# Patient Record
Sex: Female | Born: 1970 | Race: White | Hispanic: No | Marital: Single | Smoking: Former smoker
Health system: Southern US, Community
[De-identification: ages and names within clinical notes are randomized; demographics above are authoritative.]

## PROBLEM LIST (undated history)

## (undated) HISTORY — PX: OVARIAN CYST REMOVAL: SHX89

## (undated) HISTORY — PX: OOPHORECTOMY: SHX86

---

## 2013-03-31 ENCOUNTER — Telehealth: Payer: Self-pay

## 2013-03-31 NOTE — Telephone Encounter (Signed)
Left message for call back. Identifiable.  No health records on file.

## 2013-04-01 ENCOUNTER — Encounter: Payer: Self-pay | Admitting: Family Medicine

## 2013-04-01 ENCOUNTER — Ambulatory Visit (INDEPENDENT_AMBULATORY_CARE_PROVIDER_SITE_OTHER): Payer: BC Managed Care – PPO | Admitting: Family Medicine

## 2013-04-01 VITALS — BP 126/76 | HR 68 | Temp 98.4°F | Resp 16 | Ht 66.25 in | Wt 162.2 lb

## 2013-04-01 DIAGNOSIS — Z1231 Encounter for screening mammogram for malignant neoplasm of breast: Secondary | ICD-10-CM

## 2013-04-01 DIAGNOSIS — Z0000 Encounter for general adult medical examination without abnormal findings: Secondary | ICD-10-CM | POA: Insufficient documentation

## 2013-04-01 LAB — BASIC METABOLIC PANEL
BUN: 13 mg/dL (ref 6–23)
CHLORIDE: 106 meq/L (ref 96–112)
CO2: 28 mEq/L (ref 19–32)
Calcium: 9 mg/dL (ref 8.4–10.5)
Creatinine, Ser: 0.7 mg/dL (ref 0.4–1.2)
GFR: 103.39 mL/min (ref >60.00)
Glucose, Bld: 79 mg/dL (ref 70–99)
Potassium: 3.9 mEq/L (ref 3.5–5.1)
Sodium: 138 mEq/L (ref 135–145)

## 2013-04-01 LAB — HEPATIC FUNCTION PANEL
ALBUMIN: 4 g/dL (ref 3.5–5.2)
ALT: 19 U/L (ref 0–35)
AST: 21 U/L (ref 0–37)
Alkaline Phosphatase: 34 U/L — ABNORMAL LOW (ref 39–117)
Bilirubin, Direct: 0.1 mg/dL (ref 0.0–0.3)
Total Bilirubin: 0.4 mg/dL (ref 0.3–1.2)
Total Protein: 7 g/dL (ref 6.0–8.3)

## 2013-04-01 LAB — TSH: TSH: 1.12 u[IU]/mL (ref 0.35–5.50)

## 2013-04-01 LAB — LIPID PANEL
Cholesterol: 138 mg/dL (ref 0–200)
HDL: 47.5 mg/dL (ref >39.00)
LDL Cholesterol: 79 mg/dL (ref 0–99)
Total CHOL/HDL Ratio: 3
Triglycerides: 59 mg/dL (ref 0.0–149.0)
VLDL: 11.8 mg/dL (ref 0.0–40.0)

## 2013-04-01 NOTE — Progress Notes (Signed)
   Subjective:    Patient ID: Kaitlin Faulkner, female    DOB: 1970/10/27, 43 y.o.   MRN: [id number]  HPI New to establish.  No recent MD, last seen in [place] 3-4 yrs ago.  [facility]   CPE- UTD on pap, due to start mammo.  No concerns.  Review of Systems Patient reports no vision/ hearing changes, adenopathy,fever, weight change,  persistant/recurrent hoarseness , swallowing issues, chest pain, palpitations, edema, persistant/recurrent cough, hemoptysis, dyspnea (rest/exertional/paroxysmal nocturnal), gastrointestinal bleeding (melena, rectal bleeding), abdominal pain, significant heartburn, bowel changes, GU symptoms (dysuria, hematuria, incontinence), Gyn symptoms (abnormal  bleeding, pain),  syncope, focal weakness, memory loss, skin/hair/nail changes, abnormal bruising or bleeding, anxiety, or depression.  L hand numbness intermittently     Objective:   Physical Exam  General Appearance:    Alert, cooperative, no distress, appears stated age  Head:    Normocephalic, without obvious abnormality, atraumatic  Eyes:    PERRL, conjunctiva/corneas clear, EOM's intact, fundi    benign, both eyes  Ears:    Normal TM's and external ear canals, both ears  Nose:   Nares normal, septum midline, mucosa normal, no drainage    or sinus tenderness  Throat:   Lips, mucosa, and tongue normal; gums normal  Neck:   Supple, symmetrical, trachea midline, no adenopathy;    Thyroid: no enlargement/tenderness/nodules  Back:     Symmetric, no curvature, ROM normal, no CVA tenderness  Lungs:     Clear to auscultation bilaterally, respirations unlabored  Chest Wall:    No tenderness or deformity   Heart:    Regular rate and rhythm, S1 and S2 normal, no murmur, rub   or gallop  Breast Exam:    No tenderness, masses, or nipple abnormality  Abdomen:     Soft, non-tender, bowel sounds active all four quadrants,    no masses, no organomegaly  Genitalia:    deferred  Rectal:      Extremities:   Extremities normal, atraumatic, no cyanosis or edema  Pulses:   2+ and symmetric all extremities  Skin:   Skin color, texture, turgor normal, no rashes or lesions, small lipoma on L flank  Lymph nodes:   Cervical, supraclavicular, and axillary nodes normal  Neurologic:   CNII-XII intact, normal strength, sensation and reflexes    throughout          Assessment & Plan:

## 2013-04-01 NOTE — Progress Notes (Signed)
Pre visit review using our clinic review tool, if applicable. No additional management support is needed unless otherwise documented below in the visit note. 

## 2013-04-01 NOTE — Assessment & Plan Note (Signed)
Pt's PE WNL.  UTD on pap.  Due for mammo- order entered.  Check labs.  Anticipatory guidance provided.  

## 2013-04-01 NOTE — Patient Instructions (Signed)
Follow up in 1 year or as needed Keep up the good work!  You look great! We'll notify you of your lab results and make any changes if needed We'll call you with your mammogram appt Call with any questions or concerns Welcome!  We're glad to have you!!!

## 2013-04-02 ENCOUNTER — Telehealth: Payer: Self-pay | Admitting: Family Medicine

## 2013-04-02 ENCOUNTER — Encounter: Payer: Self-pay | Admitting: General Practice

## 2013-04-02 NOTE — Telephone Encounter (Signed)
Relevant patient education assigned to patient using Emmi. ° °

## 2013-04-04 LAB — VITAMIN D 1,25 DIHYDROXY
VITAMIN D3 1, 25 (OH): 46 pg/mL
Vitamin D 1, 25 (OH)2 Total: 46 pg/mL (ref 18–72)

## 2013-09-09 ENCOUNTER — Ambulatory Visit (INDEPENDENT_AMBULATORY_CARE_PROVIDER_SITE_OTHER): Payer: BC Managed Care – PPO | Admitting: Family Medicine

## 2013-09-09 ENCOUNTER — Encounter: Payer: Self-pay | Admitting: Family Medicine

## 2013-09-09 ENCOUNTER — Telehealth: Payer: Self-pay | Admitting: Family Medicine

## 2013-09-09 VITALS — BP 110/78 | HR 70 | Temp 98.5°F | Resp 16 | Wt 161.5 lb

## 2013-09-09 DIAGNOSIS — R0789 Other chest pain: Secondary | ICD-10-CM

## 2013-09-09 DIAGNOSIS — R079 Chest pain, unspecified: Secondary | ICD-10-CM | POA: Insufficient documentation

## 2013-09-09 DIAGNOSIS — R002 Palpitations: Secondary | ICD-10-CM

## 2013-09-09 LAB — CK TOTAL AND CKMB (NOT AT ARMC)
CK TOTAL: 67 U/L (ref 7–177)
CK, MB: 0.9 ng/mL (ref 0.0–5.0)

## 2013-09-09 LAB — TROPONIN I: Troponin I: 0.01 ng/mL (ref <0.06)

## 2013-09-09 MED ORDER — MELOXICAM 15 MG PO TABS: 15.0000 mg | ORAL_TABLET | Freq: Every day | ORAL | Status: DC

## 2013-09-09 NOTE — Assessment & Plan Note (Signed)
New.  Pt's CP is atypical- doesn't occur w/ exertion, no associated sxs.  + TTP over insertion of L pec.  Pt has been doing regular push ups and lifting.  Suspect this is musculoskeletal.  EKG WNL.  Get stat Trop and CKMB to r/o ischemia.  Start NSAID.  Reviewed supportive care and red flags that should prompt return.  Pt expressed understanding and is in agreement w/ plan.

## 2013-09-09 NOTE — Progress Notes (Signed)
Pre visit review using our clinic review tool, if applicable. No additional management support is needed unless otherwise documented below in the visit note. 

## 2013-09-09 NOTE — Telephone Encounter (Signed)
Left a message for call back.  

## 2013-09-09 NOTE — Telephone Encounter (Signed)
Pt. Called in with complaints of chest pain and discomfort. Refused 911 dispatch by CSR. Pt. Hung up and had to leave a message for her to call back.

## 2013-09-09 NOTE — Progress Notes (Signed)
   Subjective:    Patient ID: Kaitlin Faulkner, female    DOB: 1970-06-02, 43 y.o.   MRN: [id number]  HPI Heart 'flutter'- pt reports that starting Monday she had pressure in her upper left, lateral chest- almost in armpit.  Also had 'butterfly' feeling.  Has been lifting, exercising regularly.  No pain w/ arm movement.  No SOB.  Went jogging yesterday w/o difficulty.  No radiation of pain into neck or jaw.  No reflux.  No family hx of CAD.   Review of Systems For ROS see HPI     Objective:   Physical Exam  Vitals reviewed. Constitutional: She is oriented to person, place, and time. She appears well-developed and well-nourished. No distress.  HENT:  Head: Normocephalic and atraumatic.  Eyes: Conjunctivae and EOM are normal. Pupils are equal, round, and reactive to light.  Neck: Normal range of motion. Neck supple. No thyromegaly present.  Cardiovascular: Normal rate, regular rhythm, normal heart sounds and intact distal pulses.   No murmur heard. Pulmonary/Chest: Effort normal and breath sounds normal. No respiratory distress. She exhibits tenderness (over insertion of L pec).  Abdominal: Soft. She exhibits no distension. There is no tenderness.  Musculoskeletal: She exhibits no edema.  Lymphadenopathy:    She has no cervical adenopathy.  Neurological: She is alert and oriented to person, place, and time.  Skin: Skin is warm and dry.  Psychiatric: She has a normal mood and affect. Her behavior is normal.          Assessment & Plan:

## 2013-09-09 NOTE — Patient Instructions (Signed)
Follow up as needed Start the Mobic daily for 7-10 days and then as needed for pain We'll notify you of your lab results and make any changes if needed Call with any questions or concerns- particularly if your symptoms change or worsen Hang in there!

## 2013-09-09 NOTE — Telephone Encounter (Signed)
C/o:  Pt states that it is hard to describe what she's feeling.  States it feels like a heart flutter on the left side of chest.  Denies pain, but states it feels like a pull muscle and pressure near her left shoulder.  Started on Monday or Tuesday of this week.  She works out and runs.  She went jogging yesterday and states that she felt fine.  She only notices the above symptoms at rest.  Denies shortness of breath, nausea, vomiting, diaphoresis, or radiating pain down arm.     Advice: Appt scheduled with Dr. Birdie Riddle today at 3:30 pm.  Go to ER if symptoms worsen.  Pt agreed.

## 2013-09-13 ENCOUNTER — Encounter: Payer: Self-pay | Admitting: General Practice

## 2015-06-19 ENCOUNTER — Other Ambulatory Visit (HOSPITAL_COMMUNITY)
Admission: RE | Admit: 2015-06-19 | Discharge: 2015-06-19 | Disposition: A | Discharge status: Home / Self Care | Payer: BC Managed Care – PPO | Source: Ambulatory Visit | Attending: Family Medicine | Admitting: Family Medicine

## 2015-06-19 ENCOUNTER — Encounter: Payer: Self-pay | Admitting: Family Medicine

## 2015-06-19 ENCOUNTER — Ambulatory Visit (INDEPENDENT_AMBULATORY_CARE_PROVIDER_SITE_OTHER): Payer: BC Managed Care – PPO | Admitting: Family Medicine

## 2015-06-19 VITALS — BP 122/82 | HR 76 | Temp 98.1°F | Resp 16 | Ht 66.0 in | Wt 165.2 lb

## 2015-06-19 DIAGNOSIS — Z1151 Encounter for screening for human papillomavirus (HPV): Secondary | ICD-10-CM | POA: Insufficient documentation

## 2015-06-19 DIAGNOSIS — Z0000 Encounter for general adult medical examination without abnormal findings: Secondary | ICD-10-CM

## 2015-06-19 DIAGNOSIS — Z1231 Encounter for screening mammogram for malignant neoplasm of breast: Secondary | ICD-10-CM | POA: Diagnosis not present

## 2015-06-19 DIAGNOSIS — Z01419 Encounter for gynecological examination (general) (routine) without abnormal findings: Secondary | ICD-10-CM | POA: Insufficient documentation

## 2015-06-19 DIAGNOSIS — Z124 Encounter for screening for malignant neoplasm of cervix: Secondary | ICD-10-CM | POA: Diagnosis not present

## 2015-06-19 LAB — LIPID PANEL
CHOLESTEROL: 154 mg/dL (ref 0–200)
HDL: 43.3 mg/dL (ref >39.00)
LDL CALC: 98 mg/dL (ref 0–99)
NonHDL: 110.93
TRIGLYCERIDES: 67 mg/dL (ref 0.0–149.0)
Total CHOL/HDL Ratio: 4
VLDL: 13.4 mg/dL (ref 0.0–40.0)

## 2015-06-19 LAB — BASIC METABOLIC PANEL
BUN: 11 mg/dL (ref 6–23)
CHLORIDE: 104 meq/L (ref 96–112)
CO2: 28 mEq/L (ref 19–32)
Calcium: 9.3 mg/dL (ref 8.4–10.5)
Creatinine, Ser: 0.65 mg/dL (ref 0.40–1.20)
GFR: 105.91 mL/min (ref >60.00)
GLUCOSE: 79 mg/dL (ref 70–99)
POTASSIUM: 3.6 meq/L (ref 3.5–5.1)
SODIUM: 137 meq/L (ref 135–145)

## 2015-06-19 LAB — HEPATIC FUNCTION PANEL
ALK PHOS: 27 U/L — AB (ref 39–117)
ALT: 18 U/L (ref 0–35)
AST: 19 U/L (ref 0–37)
Albumin: 4.1 g/dL (ref 3.5–5.2)
BILIRUBIN DIRECT: 0.1 mg/dL (ref 0.0–0.3)
TOTAL PROTEIN: 7.2 g/dL (ref 6.0–8.3)
Total Bilirubin: 0.6 mg/dL (ref 0.2–1.2)

## 2015-06-19 LAB — TSH: TSH: 2.2 u[IU]/mL (ref 0.35–4.50)

## 2015-06-19 LAB — VITAMIN D 25 HYDROXY (VIT D DEFICIENCY, FRACTURES): VITD: 19.96 ng/mL — ABNORMAL LOW (ref 30.00–100.00)

## 2015-06-19 NOTE — Assessment & Plan Note (Signed)
Pap collected. 

## 2015-06-19 NOTE — Progress Notes (Signed)
   Subjective:    Patient ID: Kaitlin Faulkner, female    DOB: 08/04/1970, 45 y.o.   MRN: [id number]  HPI CPE- due for pap and mammogram.  No concerns today.   Review of Systems                                                                                                                                      No changes to vision/hearing changes, adenopathy,fever, weight change,  persistant/recurrent hoarseness , swallowing issues, chest pain, palpitations, edema, persistant/recurrent cough, hemoptysis, dyspnea (rest/exertional/paroxysmal nocturnal), gastrointestinal bleeding (melena, rectal bleeding), abdominal pain, significant heartburn, bowel changes, GU symptoms (dysuria, hematuria, incontinence), Gyn symptoms (abnormal  bleeding, pain),  syncope, focal weakness, memory loss, numbness & tingling, skin/hair/nail changes, abnormal bruising or bleeding, anxiety, or depression.      Objective:   Physical Exam  General Appearance:    Alert, cooperative, no distress, appears stated age  Head:    Normocephalic, without obvious abnormality, atraumatic  Eyes:    PERRL, conjunctiva/corneas clear, EOM's intact, fundi    benign, both eyes  Ears:    Normal TM's and external ear canals, both ears  Nose:   Nares normal, septum midline, mucosa normal, no drainage    or sinus tenderness  Throat:   Lips, mucosa, and tongue normal; teeth and gums normal  Neck:   Supple, symmetrical, trachea midline, no adenopathy;    Thyroid: no enlargement/tenderness/nodules  Back:     Symmetric, no curvature, ROM normal, no CVA tenderness  Lungs:     Clear to auscultation bilaterally, respirations unlabored  Chest Wall:    No tenderness or deformity   Heart:    Regular rate and rhythm, S1 and S2 normal, no murmur, rub   or gallop  Breast Exam:    No tenderness, masses, or nipple abnormality  Abdomen:     Soft, non-tender, bowel sounds active all four quadrants,    no masses, no organomegaly  Genitalia:     External genitalia normal, cervix normal in appearance, no CMT, uterus in normal size and position, adnexa w/out mass or tenderness, mucosa pink and moist, no lesions or discharge present  Rectal:    Normal external appearance  Extremities:   Extremities normal, atraumatic, no cyanosis or edema  Pulses:   2+ and symmetric all extremities  Skin:   Skin color, texture, turgor normal, no rashes or lesions  Lymph nodes:   Cervical, supraclavicular, and axillary nodes normal  Neurologic:   CNII-XII intact, normal strength, sensation and reflexes    throughout          Assessment & Plan:

## 2015-06-19 NOTE — Patient Instructions (Signed)
Follow up in 1 year or as needed We'll notify you of your lab results and make any changes if needed We will call you with your mammogram appt Keep up the good work on healthy diet and regular exercise- you look great! Call with any questions or concerns Happy Spring!!!

## 2015-06-19 NOTE — Assessment & Plan Note (Signed)
Pt's PE WNL.  Due for mammo- order entered.  Pt declined Tdap today- 'i hate needles'.  check labs.  Anticipatory guidance provided.

## 2015-06-21 LAB — CYTOLOGY - PAP

## 2015-06-22 ENCOUNTER — Other Ambulatory Visit: Payer: Self-pay | Admitting: General Practice

## 2015-06-22 MED ORDER — VITAMIN D (ERGOCALCIFEROL) 1.25 MG (50000 UNIT) PO CAPS: 50000.0000 [IU] | ORAL_CAPSULE | ORAL | Status: DC

## 2015-09-05 ENCOUNTER — Ambulatory Visit
Admission: RE | Admit: 2015-09-05 | Discharge: 2015-09-05 | Disposition: A | Discharge status: Home / Self Care | Payer: BC Managed Care – PPO | Source: Ambulatory Visit | Attending: Family Medicine | Admitting: Family Medicine

## 2015-09-05 DIAGNOSIS — Z1231 Encounter for screening mammogram for malignant neoplasm of breast: Secondary | ICD-10-CM

## 2017-02-05 ENCOUNTER — Encounter: Payer: Self-pay | Admitting: Family Medicine

## 2017-02-05 ENCOUNTER — Other Ambulatory Visit: Payer: Self-pay

## 2017-02-05 ENCOUNTER — Ambulatory Visit: Payer: BC Managed Care – PPO | Admitting: Family Medicine

## 2017-02-05 VITALS — BP 120/83 | HR 64 | Temp 98.3°F | Resp 18 | Ht 66.0 in | Wt 173.2 lb

## 2017-02-05 DIAGNOSIS — R5383 Other fatigue: Secondary | ICD-10-CM | POA: Diagnosis not present

## 2017-02-05 LAB — IBC PANEL
IRON: 74 ug/dL (ref 42–145)
Saturation Ratios: 16.6 % — ABNORMAL LOW (ref 20.0–50.0)
TRANSFERRIN: 318 mg/dL (ref 212.0–360.0)

## 2017-02-05 LAB — BASIC METABOLIC PANEL
BUN: 9 mg/dL (ref 6–23)
CHLORIDE: 104 meq/L (ref 96–112)
CO2: 29 mEq/L (ref 19–32)
CREATININE: 0.61 mg/dL (ref 0.40–1.20)
Calcium: 9.4 mg/dL (ref 8.4–10.5)
GFR: 113.1 mL/min (ref >60.00)
Glucose, Bld: 83 mg/dL (ref 70–99)
POTASSIUM: 4.4 meq/L (ref 3.5–5.1)
Sodium: 139 mEq/L (ref 135–145)

## 2017-02-05 LAB — HEPATIC FUNCTION PANEL
ALT: 17 U/L (ref 0–35)
AST: 18 U/L (ref 0–37)
Albumin: 4.3 g/dL (ref 3.5–5.2)
Alkaline Phosphatase: 32 U/L — ABNORMAL LOW (ref 39–117)
BILIRUBIN DIRECT: 0.1 mg/dL (ref 0.0–0.3)
BILIRUBIN TOTAL: 0.6 mg/dL (ref 0.2–1.2)
Total Protein: 7 g/dL (ref 6.0–8.3)

## 2017-02-05 LAB — VITAMIN B12: Vitamin B-12: 451 pg/mL (ref 211–911)

## 2017-02-05 LAB — TSH: TSH: 0.9 u[IU]/mL (ref 0.35–4.50)

## 2017-02-05 LAB — VITAMIN D 25 HYDROXY (VIT D DEFICIENCY, FRACTURES): VITD: 26.41 ng/mL — AB (ref 30.00–100.00)

## 2017-02-05 NOTE — Progress Notes (Signed)
Pre visit review using our clinic review tool, if applicable. No additional management support is needed unless otherwise documented below in the visit note. 

## 2017-02-05 NOTE — Progress Notes (Signed)
   Subjective:    Patient ID: Kaitlin Faulkner, female    DOB: 1971-03-08, 46 y.o.   MRN: [id number]  HPI Fatigue- pt reports sxs started 'a couple of weeks ago'.  Pt reports no change in sleep pattern.  No current medications.  Will take vitamins when she remembers.  Continues to exercise 3-4x/week.  Denies any increased stressors.  No headaches, CP, SOB.  'I feel fine, I'm just tired'.  Mom repeatedly told her as a child she had 'low iron'.   Review of Systems For ROS see HPI     Objective:   Physical Exam  Constitutional: She is oriented to person, place, and time. She appears well-developed and well-nourished. No distress.  HENT:  Head: Normocephalic and atraumatic.  Eyes: Conjunctivae and EOM are normal. Pupils are equal, round, and reactive to light.  Neck: Normal range of motion. Neck supple. No thyromegaly present.  Cardiovascular: Normal rate, regular rhythm, normal heart sounds and intact distal pulses.  No murmur heard. Pulmonary/Chest: Effort normal and breath sounds normal. No respiratory distress.  Abdominal: Soft. She exhibits no distension. There is no tenderness.  Musculoskeletal: She exhibits no edema.  Lymphadenopathy:    She has no cervical adenopathy.  Neurological: She is alert and oriented to person, place, and time.  Skin: Skin is warm and dry.  Psychiatric: She has a normal mood and affect. Her behavior is normal.  Vitals reviewed.         Assessment & Plan:  Fatigue- pt denies any other symptoms w/ exception of fatigue.  She does not report any medications or supplements.  Denies changes to sleep patterns.  Has hx of 'low iron'.  Will check labs to determine if there's a metabolic cause for her fatigue and treat any abnormalities if present.  In the meantime, encouraged her to add daily MVI.  Pt expressed understanding and is in agreement w/ plan.

## 2017-02-05 NOTE — Patient Instructions (Signed)
Schedule your complete physical in 6 months We'll notify you of your lab results and make any changes if needed Keep up the good work on healthy diet and regular exercise- you can do it! Add a daily multivitamin to help w/ energy Call with any questions or concerns Happy Thanksgiving!!!

## 2017-02-11 ENCOUNTER — Other Ambulatory Visit: Payer: Self-pay | Admitting: General Practice

## 2017-02-11 MED ORDER — VITAMIN D (ERGOCALCIFEROL) 1.25 MG (50000 UNIT) PO CAPS: 50000.0000 [IU] | ORAL_CAPSULE | ORAL | 0 refills | Status: DC

## 2017-06-06 ENCOUNTER — Other Ambulatory Visit: Payer: Self-pay | Admitting: Family Medicine

## 2017-08-06 ENCOUNTER — Encounter: Payer: BC Managed Care – PPO | Admitting: Family Medicine

## 2017-08-28 ENCOUNTER — Encounter: Payer: Self-pay | Admitting: Family Medicine

## 2017-08-28 ENCOUNTER — Other Ambulatory Visit: Payer: Self-pay

## 2017-08-28 ENCOUNTER — Ambulatory Visit: Payer: BC Managed Care – PPO | Admitting: Family Medicine

## 2017-08-28 VITALS — BP 120/78 | HR 77 | Temp 97.9°F | Resp 16 | Ht 65.75 in | Wt 171.0 lb

## 2017-08-28 DIAGNOSIS — N939 Abnormal uterine and vaginal bleeding, unspecified: Secondary | ICD-10-CM

## 2017-08-28 DIAGNOSIS — R300 Dysuria: Secondary | ICD-10-CM

## 2017-08-28 LAB — URINALYSIS, MICROSCOPIC ONLY: RBC / HPF: NONE SEEN (ref 0–?)

## 2017-08-28 LAB — POCT URINE PREGNANCY: Preg Test, Ur: NEGATIVE

## 2017-08-28 MED ORDER — NITROFURANTOIN MONOHYD MACRO 100 MG PO CAPS: 100.0000 mg | ORAL_CAPSULE | Freq: Two times a day (BID) | ORAL | 0 refills | Status: DC

## 2017-08-28 NOTE — Patient Instructions (Signed)
Please follow up if symptoms do not improve or as needed.    If you have any questions or concerns, please don't hesitate to send me a message via MyChart or call the office at [phone]. Thank you for visiting with [place] today! It's our pleasure caring for you.   Urinary Tract Infection, Adult A urinary tract infection (UTI) is an infection of any part of the urinary tract, which includes the kidneys, ureters, bladder, and urethra. These organs make, store, and get rid of urine in the body. UTI can be a bladder infection (cystitis) or kidney infection (pyelonephritis). What are the causes? This infection may be caused by fungi, viruses, or bacteria. Bacteria are the most common cause of UTIs. This condition can also be caused by repeated incomplete emptying of the bladder during urination. What increases the risk? This condition is more likely to develop if:  You ignore your need to urinate or hold urine for long periods of time.  You do not empty your bladder completely during urination.  You wipe back to front after urinating or having a bowel movement, if you are female.  You are uncircumcised, if you are female.  You are constipated.  You have a urinary catheter that stays in place (indwelling).  You have a weak defense (immune) system.  You have a medical condition that affects your bowels, kidneys, or bladder.  You have diabetes.  You take antibiotic medicines frequently or for long periods of time, and the antibiotics no longer work well against certain types of infections (antibiotic resistance).  You take medicines that irritate your urinary tract.  You are exposed to chemicals that irritate your urinary tract.  You are female.  What are the signs or symptoms? Symptoms of this condition include:  Fever.  Frequent urination or passing small amounts of urine frequently.  Needing to urinate urgently.  Pain or burning with urination.  Urine that smells bad or  unusual.  Cloudy urine.  Pain in the lower abdomen or back.  Trouble urinating.  Blood in the urine.  Vomiting or being less hungry than normal.  Diarrhea or abdominal pain.  Vaginal discharge, if you are female.  How is this diagnosed? This condition is diagnosed with a medical history and physical exam. You will also need to provide a urine sample to test your urine. Other tests may be done, including:  Blood tests.  Sexually transmitted disease (STD) testing.  If you have had more than one UTI, a cystoscopy or imaging studies may be done to determine the cause of the infections. How is this treated? Treatment for this condition often includes a combination of two or more of the following:  Antibiotic medicine.  Other medicines to treat less common causes of UTI.  Over-the-counter medicines to treat pain.  Drinking enough water to stay hydrated.  Follow these instructions at home:  Take over-the-counter and prescription medicines only as told by your health care provider.  If you were prescribed an antibiotic, take it as told by your health care provider. Do not stop taking the antibiotic even if you start to feel better.  Avoid alcohol, caffeine, tea, and carbonated beverages. They can irritate your bladder.  Drink enough fluid to keep your urine clear or pale yellow.  Keep all follow-up visits as told by your health care provider. This is important.  Make sure to: ? Empty your bladder often and completely. Do not hold urine for long periods of time. ? Empty your bladder before  and after sex. ? Wipe from front to back after a bowel movement if you are female. Use each tissue one time when you wipe. Contact a health care provider if:  You have back pain.  You have a fever.  You feel nauseous or vomit.  Your symptoms do not get better after 3 days.  Your symptoms go away and then return. Get help right away if:  You have severe back pain or lower  abdominal pain.  You are vomiting and cannot keep down any medicines or water. This information is not intended to replace advice given to you by your health care provider. Make sure you discuss any questions you have with your health care provider. Document Released: 12/12/2004 Document Revised: 08/16/2015 Document Reviewed: 01/23/2015 Elsevier Interactive Patient Education  [employer].

## 2017-08-28 NOTE — Progress Notes (Signed)
Subjective   CC:  Chief Complaint  Patient presents with  . Urinary Tract Infection    Started Monday, burning with urination, also spotting, just had menstrual spotting, urine smell    HPI: Kaitlin Faulkner is a 46 y.o. female who presents to the office today to address the problems listed above in the chief complaint.  Patient reports dysuria and urinary frequency.  She has sensation of increased urinary pressure.  She denies fevers flank pain nausea vomiting.  She noted some bright red blood in the toilet bowl.  She thought this was related to vaginal spotting although this would make her period early.  LMP was over Memorial Day weekend and was normal.  She is not on birth control.  She is had no further vaginal spotting.  No further gross hematuria.  Symptoms have been present for several days.  She denies history of interstitial cystitis.  She denies vaginal symptoms including vaginal discharge or pelvic pain.  She took one Keflex that she had leftover from a dentist visit.  She is on AZO for symptom control.  It has been helpful  Assessment  1. Dysuria   2. Vaginal spotting      Plan   Clinically consistent with acute cystitis.  She with Macrobid twice daily x5 days.  Await urine micro and culture.  Reassured.  Follow-up if unimproved or if irregular menses become a problem.  Follow up: Return if symptoms worsen or fail to improve.  Orders Placed This Encounter  Procedures  . Urine Culture  . Urine Microscopic Only  . POCT urine pregnancy   Meds ordered this encounter  Medications  . nitrofurantoin, macrocrystal-monohydrate, (MACROBID) 100 MG capsule    Sig: Take 1 capsule (100 mg total) by mouth 2 (two) times daily.    Dispense:  10 capsule    Refill:  0      I reviewed the patients updated PMH, FH, and SocHx.    Patient Active Problem List   Diagnosis Date Noted  . Pap smear for cervical cancer screening 06/19/2015  . Chest pain 09/09/2013  . Routine general  medical examination at a health care facility 04/01/2013   Current Meds  Medication Sig  . Cholecalciferol (VITAMIN D) 2000 units tablet Take 2,000 Units by mouth daily.  . vitamin B-12 (CYANOCOBALAMIN) 1000 MCG tablet Take 1,000 mcg by mouth daily.    Review of Systems: Cardiovascular: negative for chest pain Respiratory: negative for SOB or persistent cough Gastrointestinal: negative for abdominal pain Constitutional: Negative for fever malaise or anorexia  Objective  Vitals: BP 120/78   Pulse 77   Temp 97.9 F (36.6 C) (Oral)   Resp 16   Ht 5' 5.75" (1.67 m)   Wt 171 lb (77.6 kg)   SpO2 97%   BMI 27.81 kg/m  General: no acute distress  Psych:  Alert and oriented, normal mood and affect Cardiovascular:  RRR without murmur or gallop. no peripheral edema Respiratory:  Good breath sounds bilaterally, CTAB with normal respiratory effort Gastrointestinal: soft, flat abdomen, normal active bowel sounds, no palpable masses, no hepatosplenomegaly, no appreciated hernias, NO CVAT, mild suprapubic ttp w/o rebound or guarding Skin:  Warm, no rashes Neurologic:   Mental status is normal. normal gait Office Visit on 08/28/2017  Component Date Value Ref Range Status  . Preg Test, Ur 08/28/2017 Negative  Negative Final    Commons side effects, risks, benefits, and alternatives for medications and treatment plan prescribed today were discussed, and the patient  expressed understanding of the given instructions. Patient is instructed to call or message via MyChart if he/she has any questions or concerns regarding our treatment plan. No barriers to understanding were identified. We discussed Red Flag symptoms and signs in detail. Patient expressed understanding regarding what to do in case of urgent or emergency type symptoms.   Medication list was reconciled, printed and provided to the patient in AVS. Patient instructions and summary information was reviewed with the patient as documented in  the AVS. This note was prepared with assistance of Dragon voice recognition software. Occasional wrong-word or sound-a-like substitutions may have occurred due to the inherent limitations of voice recognition software

## 2017-08-31 LAB — URINE CULTURE
MICRO NUMBER: 90711573
SPECIMEN QUALITY:: ADEQUATE

## 2017-09-01 NOTE — Progress Notes (Signed)
+   UTI sensitive to abx used. No further action needed

## 2017-12-31 ENCOUNTER — Encounter

## 2017-12-31 ENCOUNTER — Ambulatory Visit (INDEPENDENT_AMBULATORY_CARE_PROVIDER_SITE_OTHER): Payer: BC Managed Care – PPO | Admitting: Family Medicine

## 2017-12-31 ENCOUNTER — Other Ambulatory Visit: Payer: Self-pay

## 2017-12-31 ENCOUNTER — Encounter: Payer: Self-pay | Admitting: Family Medicine

## 2017-12-31 VITALS — BP 118/76 | HR 56 | Temp 98.3°F | Resp 17 | Ht 66.0 in | Wt 175.1 lb

## 2017-12-31 DIAGNOSIS — E559 Vitamin D deficiency, unspecified: Secondary | ICD-10-CM

## 2017-12-31 DIAGNOSIS — E663 Overweight: Secondary | ICD-10-CM

## 2017-12-31 DIAGNOSIS — Z1231 Encounter for screening mammogram for malignant neoplasm of breast: Secondary | ICD-10-CM

## 2017-12-31 DIAGNOSIS — Z0000 Encounter for general adult medical examination without abnormal findings: Secondary | ICD-10-CM | POA: Diagnosis not present

## 2017-12-31 DIAGNOSIS — B001 Herpesviral vesicular dermatitis: Secondary | ICD-10-CM | POA: Insufficient documentation

## 2017-12-31 MED ORDER — VALACYCLOVIR HCL 1 G PO TABS: ORAL_TABLET | ORAL | 3 refills | Status: AC

## 2017-12-31 NOTE — Assessment & Plan Note (Signed)
New to provider, ongoing for pt.  Start Lysine supplement daily and take Valtrex at onset of sxs.  Pt expressed understanding and is in agreement w/ plan.

## 2017-12-31 NOTE — Patient Instructions (Signed)
Follow up in 1 year or as needed We'll notify you of your lab results and make any changes if needed Continue to work on healthy diet and regular exercise- you look great! START once daily Lysine supplement to cut down on cold sores When you feel a cold sore coming on, take the Valtrex Call with any questions or concerns Happy Fall!!!

## 2017-12-31 NOTE — Assessment & Plan Note (Signed)
Pt's PE WNL w/ exception of being overweight.  UTD on pap, due for mammo- ordered.  Declines Tdap and flu.  Check labs.  Anticipatory guidance provided.

## 2017-12-31 NOTE — Assessment & Plan Note (Signed)
Hx of this.  Check labs and replete prn.

## 2017-12-31 NOTE — Progress Notes (Signed)
   Subjective:    Patient ID: Kaitlin Faulkner, female    DOB: [birth year], 47 y.o.   MRN: [id number]  HPI CPE- UTD on pap, due for mammo.  Declines flu and Tdap.   Review of Systems Patient reports no vision/ hearing changes, adenopathy,fever, weight change,  persistant/recurrent hoarseness , swallowing issues, chest pain, palpitations, edema, persistant/recurrent cough, hemoptysis, dyspnea (rest/exertional/paroxysmal nocturnal), gastrointestinal bleeding (melena, rectal bleeding), abdominal pain, significant heartburn, bowel changes, GU symptoms (dysuria, hematuria, incontinence), Gyn symptoms (abnormal  bleeding, pain),  syncope, focal weakness, memory loss, numbness & tingling, skin/hair/nail changes, abnormal bruising or bleeding, anxiety, or depression.     Objective:   Physical Exam General Appearance:    Alert, cooperative, no distress, appears stated age  Head:    Normocephalic, without obvious abnormality, atraumatic  Eyes:    PERRL, conjunctiva/corneas clear, EOM's intact, fundi    benign, both eyes  Ears:    Normal TM's and external ear canals, both ears  Nose:   Nares normal, septum midline, mucosa normal, no drainage    or sinus tenderness  Throat:   Lips, mucosa, and tongue normal; teeth and gums normal  Neck:   Supple, symmetrical, trachea midline, no adenopathy;    Thyroid: no enlargement/tenderness/nodules  Back:     Symmetric, no curvature, ROM normal, no CVA tenderness  Lungs:     Clear to auscultation bilaterally, respirations unlabored  Chest Wall:    No tenderness or deformity   Heart:    Regular rate and rhythm, S1 and S2 normal, no murmur, rub   or gallop  Breast Exam:    Deferred to mammo  Abdomen:     Soft, non-tender, bowel sounds active all four quadrants,    no masses, no organomegaly  Genitalia:    Deferred  Rectal:    Extremities:   Extremities normal, atraumatic, no cyanosis or edema  Pulses:   2+ and symmetric all extremities  Skin:   Skin color,  texture, turgor normal, no rashes or lesions  Lymph nodes:   Cervical, supraclavicular, and axillary nodes normal  Neurologic:   CNII-XII intact, normal strength, sensation and reflexes    throughout          Assessment & Plan:

## 2018-01-01 LAB — HEPATIC FUNCTION PANEL
ALBUMIN: 4.4 g/dL (ref 3.5–5.2)
ALT: 14 U/L (ref 0–35)
AST: 14 U/L (ref 0–37)
Alkaline Phosphatase: 32 U/L — ABNORMAL LOW (ref 39–117)
Bilirubin, Direct: 0.1 mg/dL (ref 0.0–0.3)
TOTAL PROTEIN: 7.3 g/dL (ref 6.0–8.3)
Total Bilirubin: 0.3 mg/dL (ref 0.2–1.2)

## 2018-01-01 LAB — VITAMIN D 25 HYDROXY (VIT D DEFICIENCY, FRACTURES): VITD: 50.06 ng/mL (ref 30.00–100.00)

## 2018-01-01 LAB — CBC WITH DIFFERENTIAL/PLATELET
BASOS ABS: 0.1 10*3/uL (ref 0.0–0.1)
Basophils Relative: 0.4 % (ref 0.0–3.0)
Eosinophils Absolute: 0.1 10*3/uL (ref 0.0–0.7)
Eosinophils Relative: 0.9 % (ref 0.0–5.0)
HCT: 41.8 % (ref 36.0–46.0)
HEMOGLOBIN: 13.7 g/dL (ref 12.0–15.0)
LYMPHS ABS: 6.4 10*3/uL — AB (ref 0.7–4.0)
Lymphocytes Relative: 50.2 % — ABNORMAL HIGH (ref 12.0–46.0)
MCHC: 32.7 g/dL (ref 30.0–36.0)
MCV: 86.3 fl (ref 78.0–100.0)
MONOS PCT: 6 % (ref 3.0–12.0)
Monocytes Absolute: 0.8 10*3/uL (ref 0.1–1.0)
NEUTROS PCT: 42.5 % — AB (ref 43.0–77.0)
Neutro Abs: 5.4 10*3/uL (ref 1.4–7.7)
Platelets: 58 10*3/uL — ABNORMAL LOW (ref 150.0–400.0)
RBC: 4.85 Mil/uL (ref 3.87–5.11)
RDW: 13.9 % (ref 11.5–15.5)
WBC: 12.7 10*3/uL — AB (ref 4.0–10.5)

## 2018-01-01 LAB — LIPID PANEL
CHOL/HDL RATIO: 4
CHOLESTEROL: 181 mg/dL (ref 0–200)
HDL: 46.2 mg/dL (ref >39.00)
LDL CALC: 115 mg/dL — AB (ref 0–99)
NonHDL: 134.83
TRIGLYCERIDES: 101 mg/dL (ref 0.0–149.0)
VLDL: 20.2 mg/dL (ref 0.0–40.0)

## 2018-01-01 LAB — BASIC METABOLIC PANEL
BUN: 13 mg/dL (ref 6–23)
CALCIUM: 9.5 mg/dL (ref 8.4–10.5)
CO2: 27 mEq/L (ref 19–32)
CREATININE: 0.68 mg/dL (ref 0.40–1.20)
Chloride: 102 mEq/L (ref 96–112)
GFR: 99.37 mL/min (ref >60.00)
GLUCOSE: 83 mg/dL (ref 70–99)
Potassium: 4.2 mEq/L (ref 3.5–5.1)
Sodium: 137 mEq/L (ref 135–145)

## 2018-01-01 LAB — TSH: TSH: 1.13 u[IU]/mL (ref 0.35–4.50)

## 2018-01-02 ENCOUNTER — Other Ambulatory Visit: Payer: Self-pay | Admitting: Family Medicine

## 2018-01-02 ENCOUNTER — Other Ambulatory Visit: Payer: BC Managed Care – PPO

## 2018-01-02 DIAGNOSIS — D696 Thrombocytopenia, unspecified: Secondary | ICD-10-CM

## 2018-01-05 ENCOUNTER — Other Ambulatory Visit: Payer: BC Managed Care – PPO

## 2018-01-06 ENCOUNTER — Telehealth: Payer: Self-pay

## 2018-01-06 ENCOUNTER — Other Ambulatory Visit (INDEPENDENT_AMBULATORY_CARE_PROVIDER_SITE_OTHER): Payer: BC Managed Care – PPO

## 2018-01-06 DIAGNOSIS — D696 Thrombocytopenia, unspecified: Secondary | ICD-10-CM

## 2018-01-06 NOTE — Telephone Encounter (Signed)
Please advise 

## 2018-01-06 NOTE — Telephone Encounter (Signed)
Kaitlin Faulkner from main lab called and pt now has too many platelet clumps in CBC. Routing to PCP to advise.

## 2018-01-09 ENCOUNTER — Telehealth: Payer: Self-pay | Admitting: Family Medicine

## 2018-01-09 NOTE — Telephone Encounter (Signed)
Called and left a detailed message on voicemail to inform pt. Labs ordered. Please schedule pt a lab only appointment.   [place] for [facility] to Discuss results / PCP recommendations / Schedule patient.

## 2018-01-09 NOTE — Telephone Encounter (Signed)
Platelets are now clumped and we are unable to accurately assess how many there are.  Pt needs to return for CBC w/ diff at a lab only appt at her convenience to ensure we get an accurate platelet number

## 2018-01-09 NOTE — Addendum Note (Signed)
Addended by: Davis Gourd on: 01/09/2018 04:13 PM   Modules accepted: Orders

## 2018-01-09 NOTE — Telephone Encounter (Signed)
Pt called lab scheduled for Monday per pt request.

## 2018-01-12 ENCOUNTER — Other Ambulatory Visit (INDEPENDENT_AMBULATORY_CARE_PROVIDER_SITE_OTHER): Payer: BC Managed Care – PPO

## 2018-01-12 DIAGNOSIS — D696 Thrombocytopenia, unspecified: Secondary | ICD-10-CM | POA: Diagnosis not present

## 2018-01-13 LAB — CBC WITH DIFFERENTIAL/PLATELET
Basophils Absolute: 0.1 10*3/uL (ref 0.0–0.1)
Basophils Relative: 0.7 % (ref 0.0–3.0)
EOS PCT: 1.2 % (ref 0.0–5.0)
Eosinophils Absolute: 0.2 10*3/uL (ref 0.0–0.7)
HCT: 39.7 % (ref 36.0–46.0)
Hemoglobin: 13.3 g/dL (ref 12.0–15.0)
LYMPHS ABS: 6 10*3/uL — AB (ref 0.7–4.0)
Lymphocytes Relative: 48 % — ABNORMAL HIGH (ref 12.0–46.0)
MCHC: 33.4 g/dL (ref 30.0–36.0)
MCV: 86.3 fl (ref 78.0–100.0)
MONO ABS: 0.9 10*3/uL (ref 0.1–1.0)
Monocytes Relative: 7.6 % (ref 3.0–12.0)
NEUTROS ABS: 5.3 10*3/uL (ref 1.4–7.7)
NEUTROS PCT: 42.5 % — AB (ref 43.0–77.0)
PLATELETS: 96 10*3/uL — AB (ref 150.0–400.0)
RBC: 4.6 Mil/uL (ref 3.87–5.11)
RDW: 14.2 % (ref 11.5–15.5)
WBC: 12.4 10*3/uL — ABNORMAL HIGH (ref 4.0–10.5)

## 2018-01-13 NOTE — Telephone Encounter (Signed)
Called pt and LMOVM to return call.  °

## 2018-01-14 NOTE — Telephone Encounter (Signed)
Pt scheduled. Documented on lab result.

## 2018-02-09 ENCOUNTER — Telehealth: Payer: Self-pay | Admitting: Family Medicine

## 2018-02-09 ENCOUNTER — Telehealth: Payer: Self-pay | Admitting: *Deleted

## 2018-02-09 ENCOUNTER — Other Ambulatory Visit (INDEPENDENT_AMBULATORY_CARE_PROVIDER_SITE_OTHER): Payer: BC Managed Care – PPO

## 2018-02-09 DIAGNOSIS — Z1283 Encounter for screening for malignant neoplasm of skin: Secondary | ICD-10-CM

## 2018-02-09 DIAGNOSIS — D696 Thrombocytopenia, unspecified: Secondary | ICD-10-CM

## 2018-02-09 NOTE — Telephone Encounter (Signed)
Received a call from lynn at the [facility] lab.   She states that they cannot get an accurate blood count on patient.  She states that they have run the machine 4 times, and the numbers are fluctuating between 26,000 and 41,000.   She suggested either a redraw or possibly sending the patient out to a specialist. This has happened multiple times that we have drawn patient.  Routing to PCP.

## 2018-02-09 NOTE — Telephone Encounter (Signed)
Pt returned call, she would like call back regarding her labs Please call [phone]

## 2018-02-09 NOTE — Telephone Encounter (Signed)
Called pt to verify if she went to the dermatology office below. If so I will place a referral back to their office.   [place] for [facility] to Discuss results / PCP recommendations / Schedule patient.

## 2018-02-09 NOTE — Telephone Encounter (Signed)
I think she went to the [facility]

## 2018-02-09 NOTE — Telephone Encounter (Signed)
I am happy to place a derm referral for skin cancer check but I have no record of where she went previously

## 2018-02-09 NOTE — Progress Notes (Signed)
cbc

## 2018-02-09 NOTE — Telephone Encounter (Signed)
Pt came in today asking for a referral to a dermatologist for a skin cancer ck, she states that Dr. Birdie Riddle sent her somewhere before office [place] but I didn't find any notes in her chart. Pt can be reached at the home #

## 2018-02-09 NOTE — Telephone Encounter (Signed)
Please advise, there are no referrals in Pt chart.

## 2018-02-09 NOTE — Addendum Note (Signed)
Addended by: Davis Gourd on: 02/09/2018 04:23 PM   Modules accepted: Orders

## 2018-02-09 NOTE — Telephone Encounter (Signed)
Called pt and left a detailed message on voicemail to inform of PCP recommendations. Hematology referral placed.   [place] for [facility] to Discuss results / PCP recommendations / Schedule patient.

## 2018-02-09 NOTE — Telephone Encounter (Signed)
Please refer to Hematology- dx thrombocytopenia

## 2018-02-09 NOTE — Telephone Encounter (Signed)
Message from Dr. Birdie Riddle read to patient. Aware of referral.  Pt would like CB to discuss further. Has additional questions regarding actual counts.  Telephone encounter as result note not routed to [facility].  [phone]

## 2018-02-10 NOTE — Telephone Encounter (Signed)
Spoke with patient.  She was just curious as to what was going on as to why we were referring her out.   Advised patient that due to the past results and this one, we are not able to get accurate platelet counts, so we are referral to Hematology because this is their specialty and they can help figure out what is going on with this.     Patient stated verbal understanding and will wait for a phone call for an appointment with hematology.

## 2018-02-10 NOTE — Telephone Encounter (Signed)
LM for patient to call back so that we can address her concerns.

## 2018-02-10 NOTE — Telephone Encounter (Signed)
Per pt referral was placed for  [facility] for skin cancer screening.

## 2018-02-10 NOTE — Telephone Encounter (Signed)
Will document under the original note.

## 2018-02-17 ENCOUNTER — Ambulatory Visit
Admission: RE | Admit: 2018-02-17 | Discharge: 2018-02-17 | Disposition: A | Discharge status: Home / Self Care | Payer: BC Managed Care – PPO | Source: Ambulatory Visit | Attending: Family Medicine | Admitting: Family Medicine

## 2018-02-17 DIAGNOSIS — Z1231 Encounter for screening mammogram for malignant neoplasm of breast: Secondary | ICD-10-CM

## 2018-02-19 ENCOUNTER — Encounter: Payer: Self-pay | Admitting: Oncology

## 2018-02-19 ENCOUNTER — Telehealth: Payer: Self-pay | Admitting: Oncology

## 2018-02-19 NOTE — Telephone Encounter (Signed)
New referral received from Dr. Birdie Riddle for thrombocytopenia. Pt has been scheduled to see Dr. Alen Blew on 12/24 at 2pm. Pt aware to arrive 30 minutes early. Letter mailed.

## 2018-03-10 ENCOUNTER — Inpatient Hospital Stay: Payer: BC Managed Care – PPO | Attending: Oncology | Admitting: Oncology

## 2018-03-10 ENCOUNTER — Inpatient Hospital Stay: Payer: BC Managed Care – PPO

## 2018-03-10 VITALS — BP 121/88 | HR 68 | Temp 98.4°F | Resp 17 | Ht 66.0 in | Wt 175.9 lb

## 2018-03-10 DIAGNOSIS — Z87891 Personal history of nicotine dependence: Secondary | ICD-10-CM | POA: Insufficient documentation

## 2018-03-10 DIAGNOSIS — D72829 Elevated white blood cell count, unspecified: Secondary | ICD-10-CM

## 2018-03-10 DIAGNOSIS — D696 Thrombocytopenia, unspecified: Secondary | ICD-10-CM

## 2018-03-10 DIAGNOSIS — Z79899 Other long term (current) drug therapy: Secondary | ICD-10-CM | POA: Diagnosis not present

## 2018-03-10 LAB — CBC WITH DIFFERENTIAL (CANCER CENTER ONLY)
Abs Immature Granulocytes: 0.02 10*3/uL (ref 0.00–0.07)
Basophils Absolute: 0 10*3/uL (ref 0.0–0.1)
Basophils Relative: 0 %
Eosinophils Absolute: 0.1 10*3/uL (ref 0.0–0.5)
Eosinophils Relative: 1 %
HCT: 42.7 % (ref 36.0–46.0)
Hemoglobin: 13.9 g/dL (ref 12.0–15.0)
Immature Granulocytes: 0 %
Lymphocytes Relative: 48 %
Lymphs Abs: 5.6 10*3/uL — ABNORMAL HIGH (ref 0.7–4.0)
MCH: 28.5 pg (ref 26.0–34.0)
MCHC: 32.6 g/dL (ref 30.0–36.0)
MCV: 87.7 fL (ref 80.0–100.0)
Monocytes Absolute: 0.8 10*3/uL (ref 0.1–1.0)
Monocytes Relative: 7 %
Neutro Abs: 5.1 10*3/uL (ref 1.7–7.7)
Neutrophils Relative %: 44 %
Platelet Count: 182 10*3/uL (ref 150–400)
RBC: 4.87 MIL/uL (ref 3.87–5.11)
RDW: 13.7 % (ref 11.5–15.5)
WBC Count: 11.6 10*3/uL — ABNORMAL HIGH (ref 4.0–10.5)
nRBC: 0 % (ref 0.0–0.2)

## 2018-03-10 LAB — PLATELET BY CITRATE

## 2018-03-10 LAB — SAVE SMEAR(SSMR), FOR PROVIDER SLIDE REVIEW

## 2018-03-10 NOTE — Progress Notes (Signed)
Reason for the request: Thrombocytopenia  HPI: I was asked by Dr. Birdie Riddle to evaluate Kaitlin Faulkner for the evaluation of thrombocytopenia.  She is a 47 year old woman without any significant comorbid conditions was noted to have thrombocytopenia on CBC obtained on 12/31/2017.  At that time her platelet count was 50,000 with normal hemoglobin of 13 7 and white cell count of 12.7.  A repeat CBC on 01/12/2018 showed a hemoglobin of 13 3, white cell count of 12.4 and a platelet count of 96.  It was noted that her platelets were clumped on few occasions which could contribute to the abnormal laboratory readings.  She is asymptomatic from these findings without any evidence of bleeding or bruising.  She has regular menstrual cycles without any menorrhagia.  She had 2 uncomplicated pregnancies with C-section without any postoperative bleeding.  She has never been told about thrombocytopenia any abnormalities in her blood previously.  She has been diagnosed with iron deficiency in the past.  She does not report any headaches, blurry vision, syncope or seizures. Does not report any fevers, chills or sweats.  Does not report any cough, wheezing or hemoptysis.  Does not report any chest pain, palpitation, orthopnea or leg edema.  Does not report any nausea, vomiting or abdominal pain.  Does not report any constipation or diarrhea.  Does not report any skeletal complaints.    Does not report frequency, urgency or hematuria.  Does not report any skin rashes or lesions. Does not report any heat or cold intolerance.  Does not report any lymphadenopathy or petechiae.  Does not report any anxiety or depression.  Remaining review of systems is negative.    No past medical history on file.:  Past Surgical History:  Procedure Laterality Date  . OOPHORECTOMY    . OVARIAN CYST REMOVAL    :   Current Outpatient Medications:  .  Cholecalciferol (VITAMIN D) 2000 units tablet, Take 2,000 Units by mouth daily., Disp: , Rfl:   .  valACYclovir (VALTREX) 1000 MG tablet, 2 tabs x1 dose and repeat in 12 hrs at onset of cold sores., Disp: 20 tablet, Rfl: 3 .  vitamin B-12 (CYANOCOBALAMIN) 1000 MCG tablet, Take 1,000 mcg by mouth daily., Disp: , Rfl: :  Allergies  Allergen Reactions  . Penicillins Shortness Of Breath and Swelling  :  Family History  Problem Relation Age of Onset  . Hypertension Mother   . Breast cancer Neg Hx   :  Social History   Socioeconomic History  . Marital status: Single    Spouse name: Not on file  . Number of children: Not on file  . Years of education: Not on file  . Highest education level: Not on file  Occupational History  . Not on file  Social Needs  . Financial resource strain: Not on file  . Food insecurity:    Worry: Not on file    Inability: Not on file  . Transportation needs:    Medical: Not on file    Non-medical: Not on file  Tobacco Use  . Smoking status: Former Smoker    Last attempt to quit: 10/31/2016    Years since quitting: 1.3  . Smokeless tobacco: Never Used  Substance and Sexual Activity  . Alcohol use: No  . Drug use: No  . Sexual activity: Yes  Lifestyle  . Physical activity:    Days per week: Not on file    Minutes per session: Not on file  . Stress: Not on file  Relationships  . Social connections:    Talks on phone: Not on file    Gets together: Not on file    Attends religious service: Not on file    Active member of club or organization: Not on file    Attends meetings of clubs or organizations: Not on file    Relationship status: Not on file  . Intimate partner violence:    Fear of current or ex partner: Not on file    Emotionally abused: Not on file    Physically abused: Not on file    Forced sexual activity: Not on file  Other Topics Concern  . Not on file  Social History Narrative  . Not on file  :  Pertinent items are noted in HPI.  Exam: Blood pressure 121/88, pulse 68, temperature 98.4 F (36.9 C), temperature  source Oral, resp. rate 17, height 5\' 6"  (1.676 m), weight 175 lb 14.4 oz (79.8 kg), SpO2 100 %.   ECOG 0 General appearance: alert and cooperative appeared without distress. Head: atraumatic without any abnormalities. Eyes: conjunctivae/corneas clear. PERRL.  Sclera anicteric. Throat: lips, mucosa, and tongue normal; without oral thrush or ulcers. Resp: clear to auscultation bilaterally without rhonchi, wheezes or dullness to percussion. Cardio: regular rate and rhythm, S1, S2 normal, no murmur, click, rub or gallop GI: soft, non-tender; bowel sounds normal; no masses,  no organomegaly Skin: Skin color, texture, turgor normal. No rashes or lesions Lymph nodes: Cervical, supraclavicular, and axillary nodes normal. Neurologic: Grossly normal without any motor, sensory or deep tendon reflexes. Musculoskeletal: No joint deformity or effusion. CBC    Component Value Date/Time   WBC 12.4 (H) 01/12/2018 1615   RBC 4.60 01/12/2018 1615   HGB 13.3 01/12/2018 1615   HCT 39.7 01/12/2018 1615   PLT 96.0 (L) 01/12/2018 1615   MCV 86.3 01/12/2018 1615   MCHC 33.4 01/12/2018 1615   RDW 14.2 01/12/2018 1615   LYMPHSABS 6.0 (H) 01/12/2018 1615   MONOABS 0.9 01/12/2018 1615   EOSABS 0.2 01/12/2018 1615   BASOSABS 0.1 01/12/2018 1615   '  Mm 3d Screen Breast Bilateral  Result Date: 02/17/2018 CLINICAL DATA:  Screening. EXAM: DIGITAL SCREENING BILATERAL MAMMOGRAM WITH TOMO AND CAD COMPARISON:  Previous exam(s). ACR Breast Density Category c: The breast tissue is heterogeneously dense, which may obscure small masses. FINDINGS: There are no findings suspicious for malignancy. Images were processed with CAD. IMPRESSION: No mammographic evidence of malignancy. A result letter of this screening mammogram will be mailed directly to the patient. RECOMMENDATION: Screening mammogram in one year. (Code:SM-B-01Y) BI-RADS CATEGORY  1: Negative. Electronically Signed   By: Curlene Dolphin M.D.   On: 02/17/2018  16:39    Assessment and Plan:    47 year old woman with the following:  1.  Thrombocytopenia noted on 2 separate occasions with platelet count ranging between 58 and 96.  She had mild leukocytosis and normal hemoglobin.  These findings were noted in October 2019.  The differential diagnosis was reviewed today which include platelet clumping, autoimmune thrombocytopenia, reactive thrombocytopenia, and less likely a hematological condition.  Lymphoproliferative disorder is considered less likely at this time.  DIC, HUS or TTP are considered extremely unlikely.  From a management standpoint, I will repeat CBC on her today using citrate based tube to fully evaluate her platelet count without the evidence of clumping.  If her platelet count is normal using this technique, then her thrombocytopenia is due to lab error rather than a hematological condition.  Platelet  clumping does not require any intervention but careful preparation of her CBC in the future utilizing citrate based tube.  2.  Leukocytosis: Appears to be mild with mild lymphocytosis.  This appears to be reactive and less likely representation of a lymphoproliferative disorder.  Counts will be repeated today to ensure stability.   3.  Follow-up: Will be determined based on the laboratory testing.  40  minutes was spent with the patient face-to-face today.  More than 50% of time was dedicated to reviewing laboratory data, differential diagnosis and coordinating plan of care.   Thank you for the referral.  A copy of this consult has been forwarded to the requesting physician.

## 2018-03-12 ENCOUNTER — Telehealth: Payer: Self-pay

## 2018-03-12 NOTE — Telephone Encounter (Signed)
Contacted patient, made aware of results, and no need to follow up. Patient appreciative and had no other questions or concerns.

## 2018-03-12 NOTE — Telephone Encounter (Signed)
-----   Message from Wyatt Portela, MD sent at 03/12/2018  9:29 AM EST ----- Please let her know her platelets are normal. Her wbc is slightly high but nothing to worry about. No hematology follow up is needed.

## 2018-03-13 LAB — FLOW CYTOMETRY

## 2019-01-04 ENCOUNTER — Ambulatory Visit (INDEPENDENT_AMBULATORY_CARE_PROVIDER_SITE_OTHER): Payer: BC Managed Care – PPO | Admitting: Family Medicine

## 2019-01-04 ENCOUNTER — Other Ambulatory Visit: Payer: Self-pay

## 2019-01-04 ENCOUNTER — Other Ambulatory Visit (HOSPITAL_COMMUNITY)
Admission: RE | Admit: 2019-01-04 | Discharge: 2019-01-04 | Disposition: A | Discharge status: Home / Self Care | Payer: BC Managed Care – PPO | Source: Ambulatory Visit | Attending: Family Medicine | Admitting: Family Medicine

## 2019-01-04 ENCOUNTER — Encounter: Payer: Self-pay | Admitting: Family Medicine

## 2019-01-04 VITALS — BP 110/81 | HR 61 | Temp 98.1°F | Resp 16 | Ht 66.0 in | Wt 174.2 lb

## 2019-01-04 DIAGNOSIS — E559 Vitamin D deficiency, unspecified: Secondary | ICD-10-CM

## 2019-01-04 DIAGNOSIS — Z124 Encounter for screening for malignant neoplasm of cervix: Secondary | ICD-10-CM | POA: Insufficient documentation

## 2019-01-04 DIAGNOSIS — Z0000 Encounter for general adult medical examination without abnormal findings: Secondary | ICD-10-CM | POA: Diagnosis not present

## 2019-01-04 DIAGNOSIS — E663 Overweight: Secondary | ICD-10-CM | POA: Diagnosis not present

## 2019-01-04 NOTE — Assessment & Plan Note (Signed)
Pap collected. 

## 2019-01-04 NOTE — Assessment & Plan Note (Signed)
Pt's PE WNL.  UTD on mammo, pap collected today.  Pt declines Tdap and flu.  Check labs.  Anticipatory guidance provided.

## 2019-01-04 NOTE — Assessment & Plan Note (Signed)
Ongoing issue for pt.  Encouraged healthy diet and regular exercise.  Check labs to risk stratify.  Will follow. 

## 2019-01-04 NOTE — Patient Instructions (Signed)
Follow up in 1 year or as needed We'll notify you of your lab results and make any changes if needed Continue to work on healthy diet and regular exercise- you're doing great! Schedule your mammogram for December Call with any questions or concerns Stay Safe!  Stay Sane!

## 2019-01-04 NOTE — Assessment & Plan Note (Signed)
Pt has hx of this.  Check labs and replete prn. 

## 2019-01-04 NOTE — Progress Notes (Signed)
   Subjective:    Patient ID: Kaitlin Faulkner, female    DOB: [birth year], 48 y.o.   MRN: [id number]  HPI CPE- UTD on mammo, due for pap.  No record of tetanus- declines.  Declines flu.   Review of Systems Patient reports no vision/ hearing changes, adenopathy,fever, weight change,  persistant/recurrent hoarseness , swallowing issues, chest pain, palpitations, edema, persistant/recurrent cough, hemoptysis, dyspnea (rest/exertional/paroxysmal nocturnal), gastrointestinal bleeding (melena, rectal bleeding), abdominal pain, significant heartburn, bowel changes, GU symptoms (dysuria, hematuria, incontinence), Gyn symptoms (abnormal  bleeding, pain),  syncope, focal weakness, memory loss, numbness & tingling, skin/hair/nail changes, abnormal bruising or bleeding, anxiety, or depression.     Objective:   Physical Exam  General Appearance:    Alert, cooperative, no distress, appears stated age  Head:    Normocephalic, without obvious abnormality, atraumatic  Eyes:    PERRL, conjunctiva/corneas clear, EOM's intact, fundi    benign, both eyes  Ears:    Normal TM's and external ear canals, both ears  Nose:   Deferred due to GYN  Throat:   Neck:   Supple, symmetrical, trachea midline, no adenopathy;    Thyroid: no enlargement/tenderness/nodules  Back:     Symmetric, no curvature, ROM normal, no CVA tenderness  Lungs:     Clear to auscultation bilaterally, respirations unlabored  Chest Wall:    No tenderness or deformity   Heart:    Regular rate and rhythm, S1 and S2 normal, no murmur, rub   or gallop  Breast Exam:    No tenderness, masses, or nipple abnormality  Abdomen:     Soft, non-tender, bowel sounds active all four quadrants,    no masses, no organomegaly  Genitalia:    External genitalia normal, cervix normal in appearance, no CMT, uterus in normal size and position, adnexa w/out mass or tenderness, mucosa pink and moist, no lesions or discharge present  Rectal:    Normal external  appearance  Extremities:   Extremities normal, atraumatic, no cyanosis or edema  Pulses:   2+ and symmetric all extremities  Skin:   Skin color, texture, turgor normal, no rashes or lesions  Lymph nodes:   Cervical, supraclavicular, and axillary nodes normal  Neurologic:   CNII-XII intact, normal strength, sensation and reflexes    throughout          Assessment & Plan:

## 2019-01-05 ENCOUNTER — Telehealth: Payer: Self-pay

## 2019-01-05 LAB — BASIC METABOLIC PANEL
BUN: 11 mg/dL (ref 6–23)
CO2: 29 mEq/L (ref 19–32)
Calcium: 9.4 mg/dL (ref 8.4–10.5)
Chloride: 103 mEq/L (ref 96–112)
Creatinine, Ser: 0.75 mg/dL (ref 0.40–1.20)
GFR: 83.12 mL/min (ref >60.00)
Glucose, Bld: 87 mg/dL (ref 70–99)
Potassium: 4.7 mEq/L (ref 3.5–5.1)
Sodium: 137 mEq/L (ref 135–145)

## 2019-01-05 LAB — VITAMIN D 25 HYDROXY (VIT D DEFICIENCY, FRACTURES): VITD: 35.47 ng/mL (ref 30.00–100.00)

## 2019-01-05 LAB — LIPID PANEL
Cholesterol: 187 mg/dL (ref 0–200)
HDL: 51.8 mg/dL (ref >39.00)
LDL Cholesterol: 115 mg/dL — ABNORMAL HIGH (ref 0–99)
NonHDL: 134.94
Total CHOL/HDL Ratio: 4
Triglycerides: 99 mg/dL (ref 0.0–149.0)
VLDL: 19.8 mg/dL (ref 0.0–40.0)

## 2019-01-05 LAB — HEPATIC FUNCTION PANEL
ALT: 14 U/L (ref 0–35)
AST: 16 U/L (ref 0–37)
Albumin: 4.5 g/dL (ref 3.5–5.2)
Alkaline Phosphatase: 36 U/L — ABNORMAL LOW (ref 39–117)
Bilirubin, Direct: 0.1 mg/dL (ref 0.0–0.3)
Total Bilirubin: 0.6 mg/dL (ref 0.2–1.2)
Total Protein: 7 g/dL (ref 6.0–8.3)

## 2019-01-05 LAB — TSH: TSH: 1 u[IU]/mL (ref 0.35–4.50)

## 2019-01-05 NOTE — Telephone Encounter (Signed)
fyi

## 2019-01-05 NOTE — Telephone Encounter (Signed)
No repeat platelets at this time as pt saw hematology last year and they cleared her

## 2019-01-05 NOTE — Telephone Encounter (Signed)
Spoke with Santiago Glad from [place] lab. Even with both lavender and light blue top tubes, patient's platelets clumped, unable to give an accurate result. Suggested we redraw and send to [facility] to see if they are able to test. Please advise

## 2019-01-06 NOTE — Telephone Encounter (Signed)
Does patient need to come back in to recollect for CBC to be resulted at [place]?

## 2019-01-06 NOTE — Telephone Encounter (Signed)
No need to come back

## 2019-01-08 LAB — CYTOLOGY - PAP
Comment: NEGATIVE
Diagnosis: UNDETERMINED — AB
High risk HPV: NEGATIVE

## 2019-01-13 NOTE — Progress Notes (Signed)
Called pt and lmovm to return call.    [place] for [facility] to Discuss results / PCP recommendations / Schedule patient.  

## 2019-01-14 ENCOUNTER — Encounter: Payer: Self-pay | Admitting: General Practice

## 2019-04-13 ENCOUNTER — Other Ambulatory Visit: Payer: Self-pay | Admitting: Family Medicine

## 2019-04-13 DIAGNOSIS — Z1231 Encounter for screening mammogram for malignant neoplasm of breast: Secondary | ICD-10-CM

## 2019-04-14 ENCOUNTER — Ambulatory Visit
Admission: RE | Admit: 2019-04-14 | Discharge: 2019-04-14 | Disposition: A | Discharge status: Home / Self Care | Payer: BC Managed Care – PPO | Source: Ambulatory Visit | Attending: Family Medicine | Admitting: Family Medicine

## 2019-04-14 ENCOUNTER — Other Ambulatory Visit: Payer: Self-pay

## 2019-04-14 DIAGNOSIS — Z1231 Encounter for screening mammogram for malignant neoplasm of breast: Secondary | ICD-10-CM

## 2020-09-26 IMAGING — MG DIGITAL SCREENING BILAT W/ TOMO W/ CAD
8 series · 9 of 24 positions shown · non-contrast
Comparison: Previous exam(s).

CLINICAL DATA: Screening.

EXAM:
DIGITAL SCREENING BILATERAL MAMMOGRAM WITH TOMO AND CAD

[R CC synth-2D]
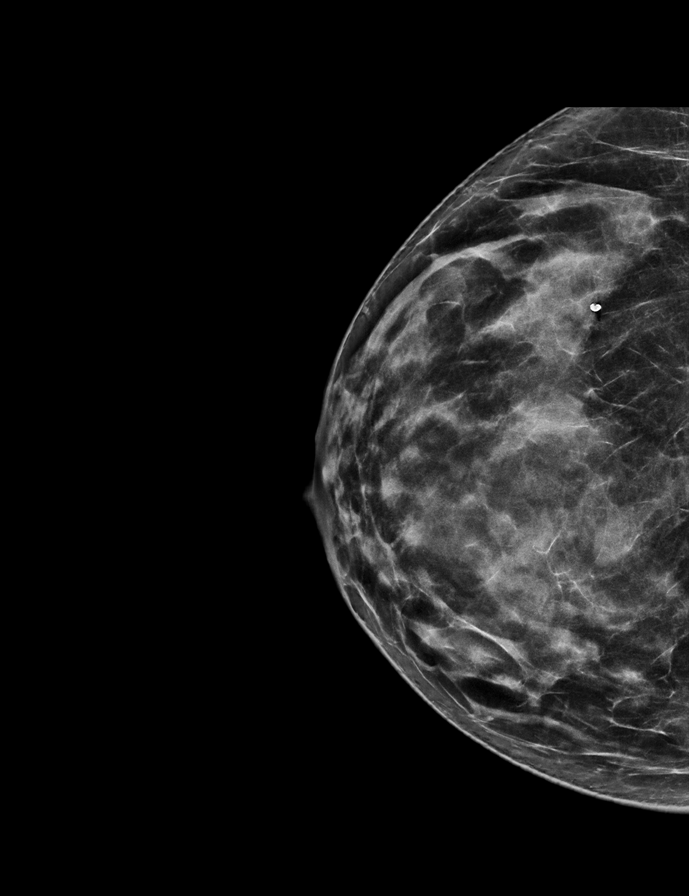

[L CC synth-2D]
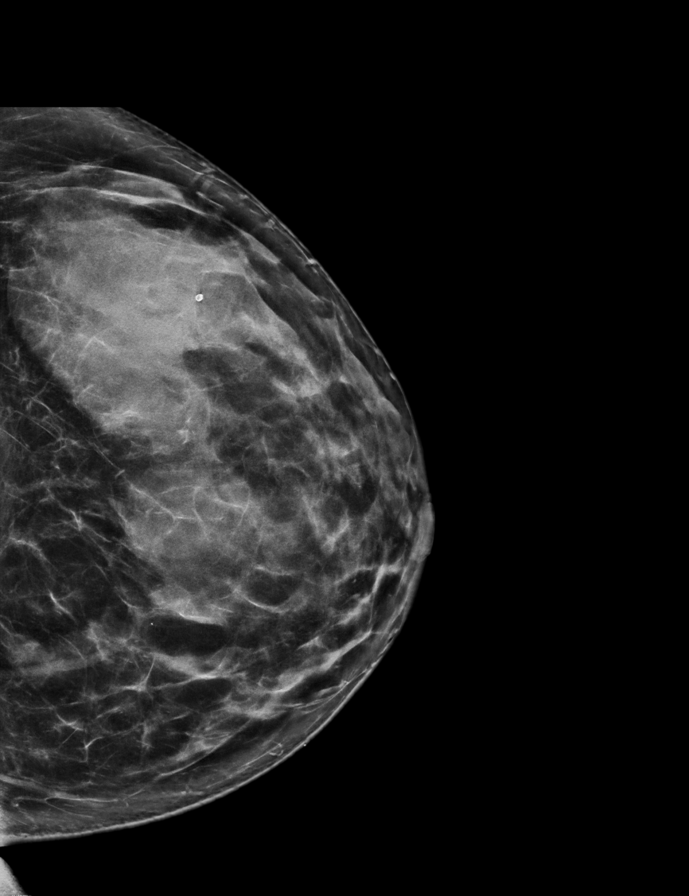

[R MLO synth-2D]
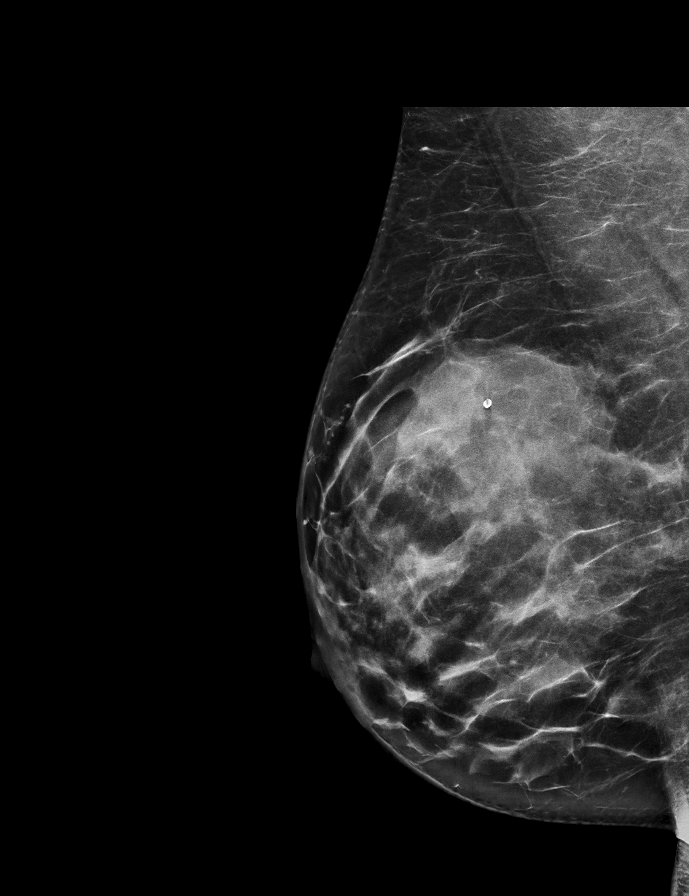

[L MLO synth-2D]
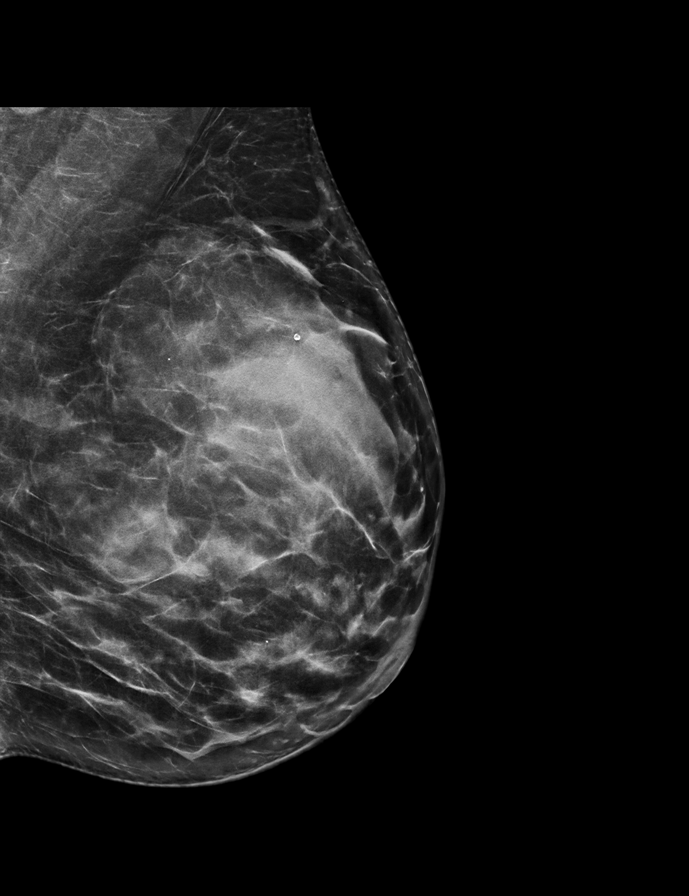

[R MLO tomo · 2 of 73 frames shown]
[frame 24/73]
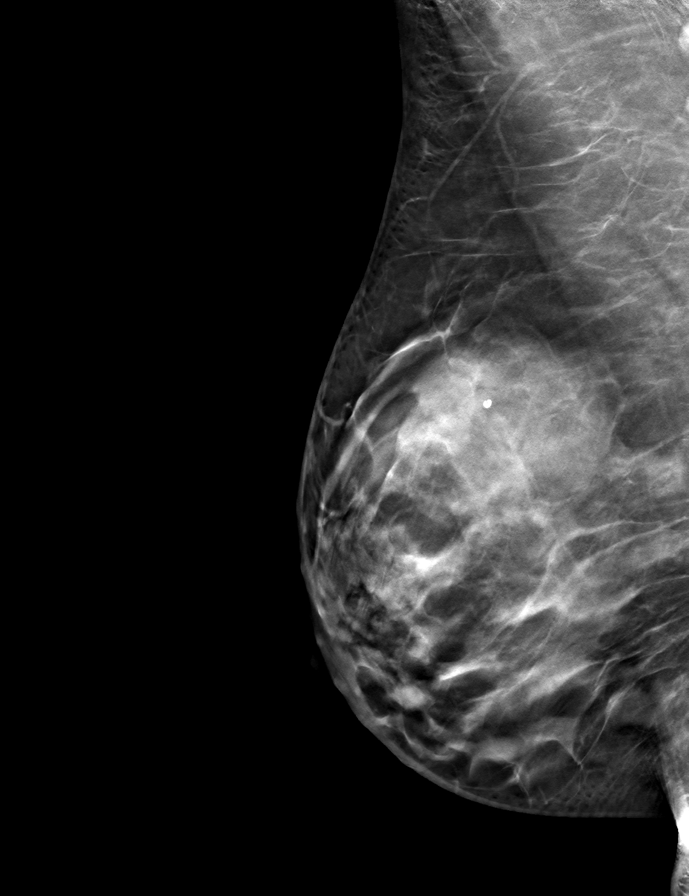
[frame 37/73]
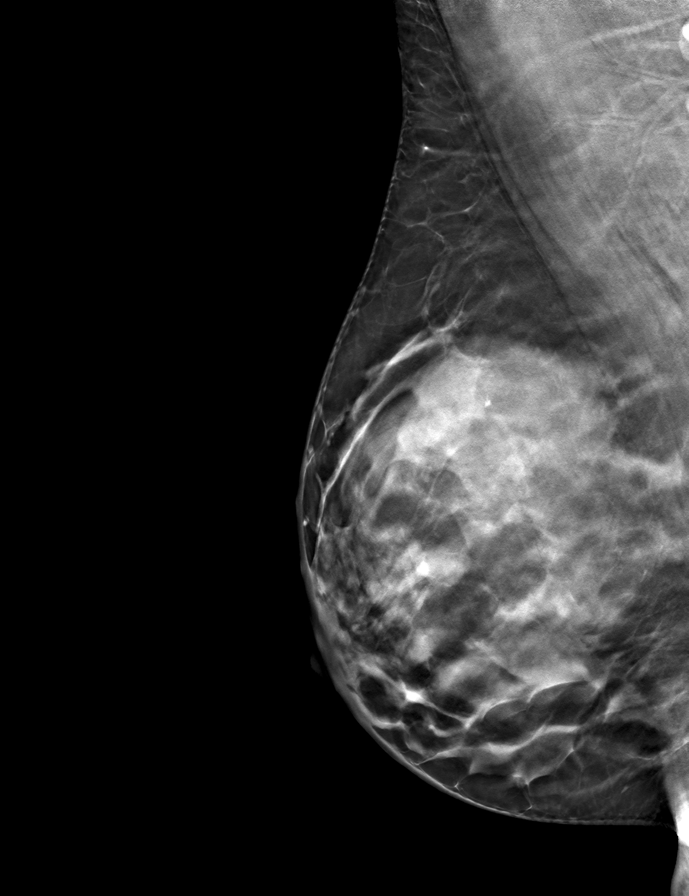

[L CC tomo · tomo slice 36/71.0]
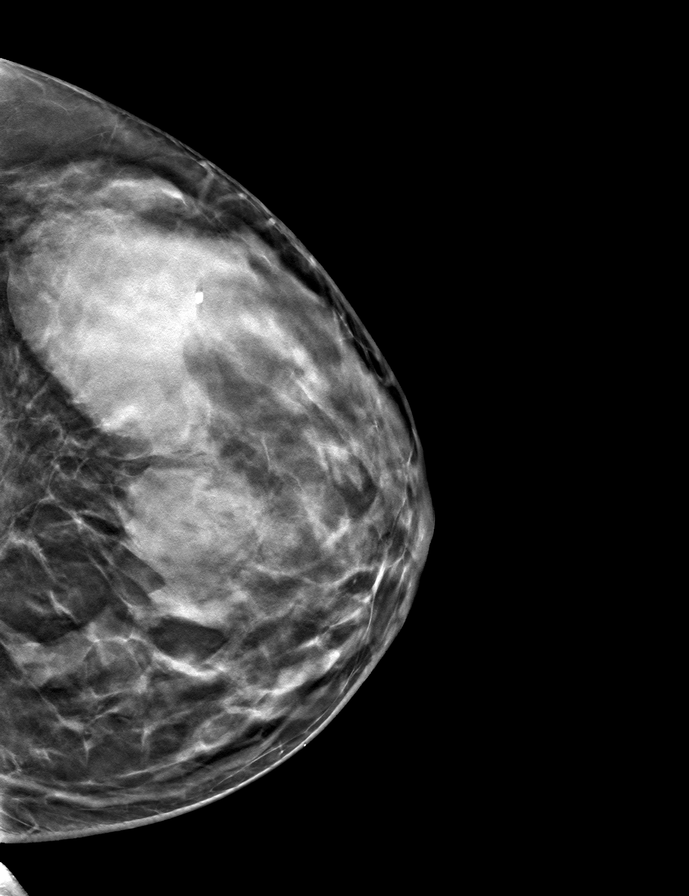

[L MLO tomo · tomo slice 37/74.0]
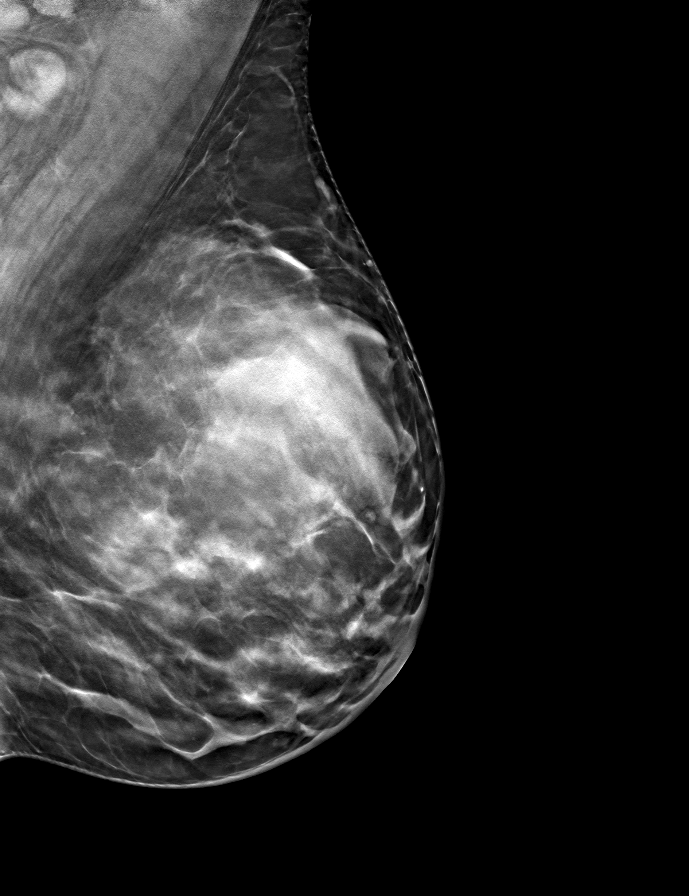

[R CC tomo · tomo slice 33/65.0]
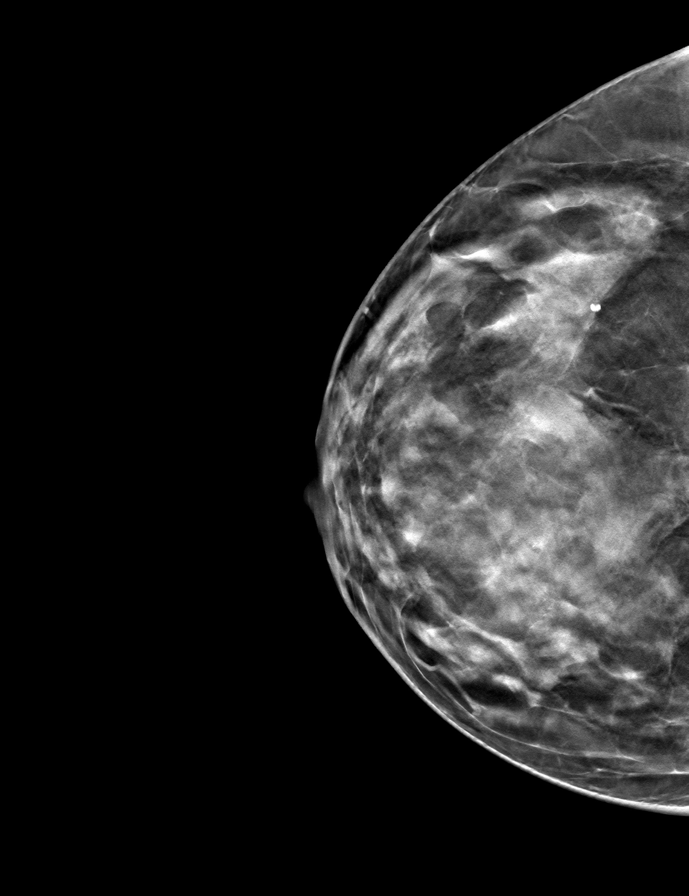

[9 of 24 positions shown; findings below may reference images not displayed]

ACR Breast Density Category d: The breast tissue is extremely dense,
which lowers the sensitivity of mammography
FINDINGS: There are no findings suspicious for malignancy. Images were
processed with CAD.
IMPRESSION: No mammographic evidence of malignancy. A result letter of this
screening mammogram will be mailed directly to the patient.

RECOMMENDATION:
Screening mammogram in one year. (Code:WO-[id number])

BI-RADS CATEGORY  1: Negative.

## 2021-05-01 ENCOUNTER — Telehealth: Payer: Self-pay | Admitting: Hematology and Oncology

## 2021-05-01 NOTE — Telephone Encounter (Signed)
Scheduled appt per 2/13 referral. Spoke to pt who is aware of appt date and time. Pt requested to see Dr. Lorenso Courier. Pt is aware to arrive 15 mins prior to appt time.

## 2021-06-15 ENCOUNTER — Inpatient Hospital Stay: Payer: BC Managed Care – PPO | Attending: Hematology and Oncology | Admitting: Hematology and Oncology

## 2021-06-15 ENCOUNTER — Other Ambulatory Visit: Payer: Self-pay

## 2021-06-15 ENCOUNTER — Inpatient Hospital Stay: Payer: BC Managed Care – PPO

## 2021-06-15 VITALS — BP 108/74 | HR 72 | Temp 97.7°F | Wt 181.8 lb

## 2021-06-15 DIAGNOSIS — D696 Thrombocytopenia, unspecified: Secondary | ICD-10-CM | POA: Insufficient documentation

## 2021-06-15 DIAGNOSIS — Z79899 Other long term (current) drug therapy: Secondary | ICD-10-CM | POA: Diagnosis not present

## 2021-06-15 DIAGNOSIS — D72820 Lymphocytosis (symptomatic): Secondary | ICD-10-CM

## 2021-06-15 LAB — CMP (CANCER CENTER ONLY)
ALT: 12 U/L (ref 0–44)
AST: 13 U/L — ABNORMAL LOW (ref 15–41)
Albumin: 4.4 g/dL (ref 3.5–5.0)
Alkaline Phosphatase: 32 U/L — ABNORMAL LOW (ref 38–126)
Anion gap: 7 (ref 5–15)
BUN: 11 mg/dL (ref 6–20)
CO2: 25 mmol/L (ref 22–32)
Calcium: 9.2 mg/dL (ref 8.9–10.3)
Chloride: 108 mmol/L (ref 98–111)
Creatinine: 0.63 mg/dL (ref 0.44–1.00)
GFR, Estimated: >60 mL/min (ref >60)
Glucose, Bld: 90 mg/dL (ref 70–99)
Potassium: 4.3 mmol/L (ref 3.5–5.1)
Sodium: 140 mmol/L (ref 135–145)
Total Bilirubin: 0.6 mg/dL (ref 0.3–1.2)
Total Protein: 7.5 g/dL (ref 6.5–8.1)

## 2021-06-15 LAB — CBC WITH DIFFERENTIAL (CANCER CENTER ONLY)
Abs Immature Granulocytes: 0 10*3/uL (ref 0.00–0.07)
Basophils Absolute: 0 10*3/uL (ref 0.0–0.1)
Basophils Relative: 0 %
Eosinophils Absolute: 0.2 10*3/uL (ref 0.0–0.5)
Eosinophils Relative: 1 %
HCT: 41.4 % (ref 36.0–46.0)
Hemoglobin: 13.8 g/dL (ref 12.0–15.0)
Lymphocytes Relative: 42 %
Lymphs Abs: 7.7 10*3/uL — ABNORMAL HIGH (ref 0.7–4.0)
MCH: 28.5 pg (ref 26.0–34.0)
MCHC: 33.3 g/dL (ref 30.0–36.0)
MCV: 85.4 fL (ref 80.0–100.0)
Monocytes Absolute: 0.9 10*3/uL (ref 0.1–1.0)
Monocytes Relative: 5 %
Neutro Abs: 9.6 10*3/uL — ABNORMAL HIGH (ref 1.7–7.7)
Neutrophils Relative %: 52 %
Platelet Count: 176 10*3/uL (ref 150–400)
RBC: 4.85 MIL/uL (ref 3.87–5.11)
RDW: 13.4 % (ref 11.5–15.5)
Smear Review: NORMAL
WBC Count: 18.4 10*3/uL — ABNORMAL HIGH (ref 4.0–10.5)
nRBC: 0 % (ref 0.0–0.2)

## 2021-06-15 LAB — SAVE SMEAR(SSMR), FOR PROVIDER SLIDE REVIEW

## 2021-06-15 LAB — LACTATE DEHYDROGENASE: LDH: 129 U/L (ref 98–192)

## 2021-06-15 LAB — C-REACTIVE PROTEIN: CRP: 0.5 mg/dL (ref <1.0)

## 2021-06-15 LAB — SEDIMENTATION RATE: Sed Rate: 0 mm/hr (ref 0–22)

## 2021-06-15 NOTE — Progress Notes (Signed)
?[place] ?[phone]   [phone] ? ?INITIAL CONSULT NOTE ? ?Patient Care Team: ?Midge Minium, MD as PCP - General (Family Medicine) ? ?Hematological/Oncological History ?# Lymphocytosis/Leukocytosis ?# Thrombocytopenia  ?06/15/2021: establish care with Dr. Lorenso Courier  ? ?CHIEF COMPLAINTS/PURPOSE OF CONSULTATION:  ?"Leukocytosis/Thrombocytopenia " ? ?HISTORY OF PRESENTING ILLNESS:  ?Kaitlin Faulkner 51 y.o. female with no remarkable medical history who presents for evaluation of a leukocytosis/leukocytosis and thrombocytopenia. ? ?On review of the previous records Kaitlin Faulkner was previously evaluated for this leukocytosis back in December 2019.  It appears she was lost to follow-up at that time.  She has a long history of lymphocytosis dating back to at least 12/31/2017.  At that time she was noted to have white blood cell count 12.7 with an absolute for site count of 6.4 and platelets of 58.  Fortunately in the interim her platelets have recovered.  Most recently on 06/15/2021 the patient was found to have a white blood cell count 18.4 with platelets of 176.  Her lymphocytes were at 7.7.  Due to concern for her prior thrombocytopenia and elevated lymphocyte she was referred to hematology for further evaluation and management. ? ?On exam today Kaitlin Faulkner ([id number]) reports that she is not having any issues with bleeding or bruising.  She notes that she does not have any infectious symptoms such as fevers, chills, sweats, nausea, vomiting or diarrhea.  She denies any lymphadenopathy that she can palpate.  Her appetite has been good she has not been having any swelling in her lower extremities or pain in her abdomen.  She notes that her energy levels are quite good and that she is at her baseline level of health today. ? ?On further discussion she notes that her mother has a history of high blood pressure and her maternal grandmother has type 2 diabetes.  She notes  that there are no cancers in her family.  She notes that she was a light smoker in college but never a particular heavy smoker.  She reports that she drinks about 2 to 3 glasses of wine once per week.  She currently works as a Animal nutritionist.  She notes she does not have any questions comments or concerns today.  She denies any fevers, chills, sweats, nausea, vomiting or diarrhea.  Full 10 point ROS is listed below. ? ?MEDICAL HISTORY:  ?No past medical history on file. ? ?SURGICAL HISTORY: ?Past Surgical History:  ?Procedure Laterality Date  ? OOPHORECTOMY    ? OVARIAN CYST REMOVAL    ? ? ?SOCIAL HISTORY: ?Social History  ? ?Socioeconomic History  ? Marital status: Single  ?  Spouse name: Not on file  ? Number of children: Not on file  ? Years of education: Not on file  ? Highest education level: Not on file  ?Occupational History  ? Not on file  ?Tobacco Use  ? Smoking status: Former  ?  Types: Cigarettes  ?  Quit date: 10/31/2016  ?  Years since quitting: 4.6  ? Smokeless tobacco: Never  ?Vaping Use  ? Vaping Use: Never used  ?Substance and Sexual Activity  ? Alcohol use: No  ? Drug use: No  ? Sexual activity: Yes  ?Other Topics Concern  ? Not on file  ?Social History Narrative  ? Not on file  ? ?Social Determinants of Health  ? ?Financial Resource Strain: Not on file  ?Food Insecurity: Not on file  ?Transportation Needs: Not on file  ?Physical Activity: Not on  file  ?Stress: Not on file  ?Social Connections: Not on file  ?Intimate Partner Violence: Not on file  ? ? ?FAMILY HISTORY: ?Family History  ?Problem Relation Age of Onset  ? Hypertension Mother   ? Breast cancer Neg Hx   ? ? ?ALLERGIES:  is allergic to penicillins. ? ?MEDICATIONS:  ?Current Outpatient Medications  ?Medication Sig Dispense Refill  ? Cholecalciferol (VITAMIN D) 2000 units tablet Take 2,000 Units by mouth daily.    ? valACYclovir (VALTREX) 1000 MG tablet 2 tabs x1 dose and repeat in 12 hrs at onset of cold sores. 20 tablet 3  ? vitamin B-12  (CYANOCOBALAMIN) 1000 MCG tablet Take 1,000 mcg by mouth daily.    ? ?No current facility-administered medications for this visit.  ? ? ?REVIEW OF SYSTEMS:   ?Constitutional: ( - ) fevers, ( - )  chills , ( - ) night sweats ?Eyes: ( - ) blurriness of vision, ( - ) double vision, ( - ) watery eyes ?Ears, nose, mouth, throat, and face: ( - ) mucositis, ( - ) sore throat ?Respiratory: ( - ) cough, ( - ) dyspnea, ( - ) wheezes ?Cardiovascular: ( - ) palpitation, ( - ) chest discomfort, ( - ) lower extremity swelling ?Gastrointestinal:  ( - ) nausea, ( - ) heartburn, ( - ) change in bowel habits ?Skin: ( - ) abnormal skin rashes ?Lymphatics: ( - ) new lymphadenopathy, ( - ) easy bruising ?Neurological: ( - ) numbness, ( - ) tingling, ( - ) new weaknesses ?Behavioral/Psych: ( - ) mood change, ( - ) new changes  ?All other systems were reviewed with the patient and are negative. ? ?PHYSICAL EXAMINATION: ? ?Vitals:  ? 06/15/21 0902  ?BP: 108/74  ?Pulse: 72  ?Temp: 97.7 ?F (36.5 ?C)  ?SpO2: 99%  ? ?Filed Weights  ? 06/15/21 0902  ?Weight: 181 lb 12.8 oz (82.5 kg)  ? ? ?GENERAL: well appearing middle-aged Caucasian female in NAD  ?SKIN: skin color, texture, turgor are normal, no rashes or significant lesions ?EYES: conjunctiva are pink and non-injected, sclera clear ?LUNGS: clear to auscultation and percussion with normal breathing effort ?HEART: regular rate & rhythm and no murmurs and no lower extremity edema ?Musculoskeletal: no cyanosis of digits and no clubbing  ?PSYCH: alert & oriented x 3, fluent speech ?NEURO: no focal motor/sensory deficits ? ?LABORATORY DATA:  ?I have reviewed the data as listed ? ?  Latest Ref Rng & Units 06/15/2021  ? 10:22 AM 03/10/2018  ?  2:44 PM 01/12/2018  ?  4:15 PM  ?CBC  ?WBC 4.0 - 10.5 K/uL 18.4   11.6   12.4    ?Hemoglobin 12.0 - 15.0 g/dL 13.8   13.9   13.3    ?Hematocrit 36.0 - 46.0 % 41.4   42.7   39.7    ?Platelets 150 - 400 K/uL 176   182   96.0    ? ? ? ?  Latest Ref Rng & Units  06/15/2021  ? 10:22 AM 01/04/2019  ?  3:12 PM 12/31/2017  ?  3:04 PM  ?CMP  ?Glucose 70 - 99 mg/dL 90   87   83    ?BUN 6 - 20 mg/dL '11   11   13    '$ ?Creatinine 0.44 - 1.00 mg/dL 0.63   0.75   0.68    ?Sodium 135 - 145 mmol/L 140   137   137    ?Potassium 3.5 - 5.1 mmol/L 4.3  4.7   4.2    ?Chloride 98 - 111 mmol/L 108   103   102    ?CO2 22 - 32 mmol/L '25   29   27    '$ ?Calcium 8.9 - 10.3 mg/dL 9.2   9.4   9.5    ?Total Protein 6.5 - 8.1 g/dL 7.5   7.0   7.3    ?Total Bilirubin 0.3 - 1.2 mg/dL 0.6   0.6   0.3    ?Alkaline Phos 38 - 126 U/L 32   36   32    ?AST 15 - 41 U/L '13   16   14    '$ ?ALT 0 - 44 U/L '12   14   14    '$ ? ? ? ?ASSESSMENT & PLAN ?Kyrstal Monterrosa 51 y.o. female with no remarkable medical history who presents for evaluation of a leukocytosis/leukocytosis and thrombocytopenia. ? 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  She does not have any evidence lymphadenopathy or splenomegaly.  Also at this time she does not have any anemia or thrombocytopenia.  As such there is no clear indication for treatment.  I would recommend return to clinic in approximately 6 months time in order to order a CLL prognostic panel and to monitor further. ? ?# CLL Rai Stage 0 ?-- Findings at this time are most consistent with chronic lymphocytic leukemia.  Her white blood cell count has been elevated since at least 2019.  Fortunately she is not having any anemia or thrombocytopenia at this time ?--Recommend at next visit ordering a CLL prognostic panel ?--No clear indication for treatment at this time ?--Return to clinic in 6 months time. ? ?# Thrombocytopenia ?--resolved, Plt 176 today  ? ?Orders Placed This Encounter  ?Procedures  ? US Abdomen Complete  ?  Standing Status:   Future  ?  Standing Expiration Date:   06/15/2022  ?  Order Specific Question:   Reason for Exam (SYMPTOM  OR DIAGNOSIS  REQUIRED)  ?  Answer:   assess for liver disease, splenomegaly  ?  Order Specific Question:   Preferred imaging location?  ?  Answer:   [facility]  ? CBC with Differential ([place] Only)

## 2021-06-18 LAB — SURGICAL PATHOLOGY

## 2021-06-19 LAB — FLOW CYTOMETRY

## 2021-06-27 ENCOUNTER — Telehealth: Payer: Self-pay | Admitting: Hematology and Oncology

## 2021-06-27 NOTE — Telephone Encounter (Signed)
Per 4/11 in basket.  Call pt and spoke to pt.  Pt Confirmed appointment  ?

## 2021-07-04 ENCOUNTER — Ambulatory Visit (HOSPITAL_COMMUNITY): Admission: RE | Admit: 2021-07-04 | Payer: BC Managed Care – PPO | Source: Ambulatory Visit

## 2021-07-31 ENCOUNTER — Ambulatory Visit (HOSPITAL_COMMUNITY): Admission: RE | Admit: 2021-07-31 | Payer: BC Managed Care – PPO | Source: Ambulatory Visit

## 2021-08-16 ENCOUNTER — Ambulatory Visit (HOSPITAL_COMMUNITY)
Admission: RE | Admit: 2021-08-16 | Discharge: 2021-08-16 | Disposition: A | Discharge status: Home / Self Care | Payer: BC Managed Care – PPO | Source: Ambulatory Visit | Attending: Hematology and Oncology | Admitting: Hematology and Oncology

## 2021-08-16 DIAGNOSIS — D72820 Lymphocytosis (symptomatic): Secondary | ICD-10-CM | POA: Diagnosis present

## 2021-08-16 DIAGNOSIS — D696 Thrombocytopenia, unspecified: Secondary | ICD-10-CM | POA: Insufficient documentation

## 2021-08-17 ENCOUNTER — Telehealth: Payer: Self-pay | Admitting: *Deleted

## 2021-08-17 NOTE — Telephone Encounter (Signed)
TCT patient regarding recent ABD [place]. Spoke with her and advised that the US showed no evidence of an enlarged spleen or any problems with her liver.  Advised that we will see her back in October 2023 for repeat labs and MD appt. Pt voiced understanding

## 2021-08-17 NOTE — Telephone Encounter (Signed)
-----   Message from Orson Slick, MD sent at 08/17/2021  8:05 AM EDT ----- Please let Kaitlin Faulkner know that her US abdomen showed no evidence of enlarged spleen or liver trouble. We will plan to see as scheduled in Oct 2023.   ----- Message ----- From: Buel Ream, Rad Results In Sent: 08/16/2021   2:58 PM EDT To: Orson Slick, MD

## 2021-12-27 ENCOUNTER — Other Ambulatory Visit: Payer: BC Managed Care – PPO

## 2021-12-27 ENCOUNTER — Ambulatory Visit: Payer: BC Managed Care – PPO | Admitting: Hematology and Oncology

## 2022-01-03 ENCOUNTER — Inpatient Hospital Stay: Payer: BC Managed Care – PPO | Attending: Hematology and Oncology

## 2022-01-03 ENCOUNTER — Other Ambulatory Visit: Payer: Self-pay

## 2022-01-03 ENCOUNTER — Inpatient Hospital Stay: Payer: BC Managed Care – PPO | Admitting: Hematology and Oncology

## 2022-01-03 ENCOUNTER — Other Ambulatory Visit: Payer: Self-pay | Admitting: Hematology and Oncology

## 2022-01-03 VITALS — BP 132/94 | HR 65 | Temp 98.0°F | Resp 14 | Wt 183.3 lb

## 2022-01-03 DIAGNOSIS — Z8249 Family history of ischemic heart disease and other diseases of the circulatory system: Secondary | ICD-10-CM | POA: Diagnosis not present

## 2022-01-03 DIAGNOSIS — Z880 Allergy status to penicillin: Secondary | ICD-10-CM | POA: Insufficient documentation

## 2022-01-03 DIAGNOSIS — Z87891 Personal history of nicotine dependence: Secondary | ICD-10-CM | POA: Diagnosis not present

## 2022-01-03 DIAGNOSIS — D72820 Lymphocytosis (symptomatic): Secondary | ICD-10-CM

## 2022-01-03 DIAGNOSIS — C9110 Chronic lymphocytic leukemia of B-cell type not having achieved remission: Secondary | ICD-10-CM | POA: Insufficient documentation

## 2022-01-03 DIAGNOSIS — Z79899 Other long term (current) drug therapy: Secondary | ICD-10-CM | POA: Diagnosis not present

## 2022-01-03 DIAGNOSIS — Z90721 Acquired absence of ovaries, unilateral: Secondary | ICD-10-CM | POA: Insufficient documentation

## 2022-01-03 LAB — CBC WITH DIFFERENTIAL (CANCER CENTER ONLY)
Abs Immature Granulocytes: 0.02 10*3/uL (ref 0.00–0.07)
Basophils Absolute: 0.1 10*3/uL (ref 0.0–0.1)
Basophils Relative: 0 %
Eosinophils Absolute: 0.1 10*3/uL (ref 0.0–0.5)
Eosinophils Relative: 0 %
HCT: 44 % (ref 36.0–46.0)
Hemoglobin: 14.5 g/dL (ref 12.0–15.0)
Immature Granulocytes: 0 %
Lymphocytes Relative: 76 %
Lymphs Abs: 12 10*3/uL — ABNORMAL HIGH (ref 0.7–4.0)
MCH: 28.2 pg (ref 26.0–34.0)
MCHC: 33 g/dL (ref 30.0–36.0)
MCV: 85.4 fL (ref 80.0–100.0)
Monocytes Absolute: 0.7 10*3/uL (ref 0.1–1.0)
Monocytes Relative: 4 %
Neutro Abs: 3.2 10*3/uL (ref 1.7–7.7)
Neutrophils Relative %: 20 %
Platelet Count: 173 10*3/uL (ref 150–400)
RBC: 5.15 MIL/uL — ABNORMAL HIGH (ref 3.87–5.11)
RDW: 13.2 % (ref 11.5–15.5)
WBC Count: 16.1 10*3/uL — ABNORMAL HIGH (ref 4.0–10.5)
nRBC: 0 % (ref 0.0–0.2)

## 2022-01-03 LAB — CMP (CANCER CENTER ONLY)
ALT: 21 U/L (ref 0–44)
AST: 18 U/L (ref 15–41)
Albumin: 4.4 g/dL (ref 3.5–5.0)
Alkaline Phosphatase: 48 U/L (ref 38–126)
Anion gap: 5 (ref 5–15)
BUN: 11 mg/dL (ref 6–20)
CO2: 30 mmol/L (ref 22–32)
Calcium: 9.7 mg/dL (ref 8.9–10.3)
Chloride: 105 mmol/L (ref 98–111)
Creatinine: 0.66 mg/dL (ref 0.44–1.00)
GFR, Estimated: >60 mL/min (ref >60)
Glucose, Bld: 97 mg/dL (ref 70–99)
Potassium: 4.6 mmol/L (ref 3.5–5.1)
Sodium: 140 mmol/L (ref 135–145)
Total Bilirubin: 0.5 mg/dL (ref 0.3–1.2)
Total Protein: 7.3 g/dL (ref 6.5–8.1)

## 2022-01-03 LAB — LACTATE DEHYDROGENASE: LDH: 132 U/L (ref 98–192)

## 2022-01-03 NOTE — Progress Notes (Signed)
[place] [phone]   [phone]  PROGRESS NOTE  Patient Care Team: Fanny Bien, MD as PCP - General (Family Medicine)  Hematological/Oncological History # CLL Rai Stage 0  06/15/2021: establish care with Dr. Lorenso Courier   Interval History:  Kaitlin Faulkner 51 y.o. female with medical history significant for CLL who presents for a follow up visit. The patient's last visit was on 06/15/2021 at which time she established care. In the interim since the last visit she has had no major changes in her health.  On exam today Kaitlin Faulkner reports that she is doing well overall in the interim since her last visit.  She has had no major changes in her health.  She is not taking any new medications though she is concerned her weight has been increasing slightly.  She thinks that she may be starting to go through menopause.  She is doing her best to work out and play tennis and her energy levels are quite good.  She is not having any fevers, chills, sweats though she is "hot every now and again".  She has not noticed any overt lymphadenopathy though she does have a follow-up on her shoulder which is more consistent with a lipoma.  Has not been having any issues with bleeding, bruising, or dark stools.  Full 10 point ROS was otherwise negative.  The bulk of our discussion focused on the diagnosis of CLL and the steps moving forward.  She voiced understanding that today we collected a prognostic panel will need to follow with her once or twice a year in order to monitor for progression of disease.  MEDICAL HISTORY:  No past medical history on file.  SURGICAL HISTORY: Past Surgical History:  Procedure Laterality Date   OOPHORECTOMY     OVARIAN CYST REMOVAL      SOCIAL HISTORY: Social History   Socioeconomic History   Marital status: Single    Spouse name: Not on file   Number of children: Not on file   Years of education: Not on file   Highest  education level: Not on file  Occupational History   Not on file  Tobacco Use   Smoking status: Former    Types: Cigarettes    Quit date: 10/31/2016    Years since quitting: 5.1   Smokeless tobacco: Never  Vaping Use   Vaping Use: Never used  Substance and Sexual Activity   Alcohol use: No   Drug use: No   Sexual activity: Yes  Other Topics Concern   Not on file  Social History Narrative   Not on file   Social Determinants of Health   Financial Resource Strain: Not on file  Food Insecurity: Not on file  Transportation Needs: Not on file  Physical Activity: Not on file  Stress: Not on file  Social Connections: Not on file  Intimate Partner Violence: Not on file    FAMILY HISTORY: Family History  Problem Relation Age of Onset   Hypertension Mother    Breast cancer Neg Hx     ALLERGIES:  is allergic to penicillins.  MEDICATIONS:  Current Outpatient Medications  Medication Sig Dispense Refill   Cholecalciferol (VITAMIN D) 2000 units tablet Take 2,000 Units by mouth daily.     valACYclovir (VALTREX) 1000 MG tablet 2 tabs x1 dose and repeat in 12 hrs at onset of cold sores. 20 tablet 3   vitamin B-12 (CYANOCOBALAMIN) 1000 MCG tablet Take 1,000 mcg by mouth daily.  No current facility-administered medications for this visit.    REVIEW OF SYSTEMS:   Constitutional: ( - ) fevers, ( - )  chills , ( - ) night sweats Eyes: ( - ) blurriness of vision, ( - ) double vision, ( - ) watery eyes Ears, nose, mouth, throat, and face: ( - ) mucositis, ( - ) sore throat Respiratory: ( - ) cough, ( - ) dyspnea, ( - ) wheezes Cardiovascular: ( - ) palpitation, ( - ) chest discomfort, ( - ) lower extremity swelling Gastrointestinal:  ( - ) nausea, ( - ) heartburn, ( - ) change in bowel habits Skin: ( - ) abnormal skin rashes Lymphatics: ( - ) new lymphadenopathy, ( - ) easy bruising Neurological: ( - ) numbness, ( - ) tingling, ( - ) new weaknesses Behavioral/Psych: ( - ) mood  change, ( - ) new changes  All other systems were reviewed with the patient and are negative.  PHYSICAL EXAMINATION:  Vitals:   01/03/22 0827  BP: (!) 132/94  Pulse: 65  Resp: 14  Temp: 98 F (36.7 C)  SpO2: 97%   Filed Weights   01/03/22 0827  Weight: 183 lb 4.8 oz (83.1 kg)    GENERAL: Well-appearing middle-age Caucasian female alert, no distress and comfortable SKIN: skin color, texture, turgor are normal, no rashes or significant lesions EYES: conjunctiva are pink and non-injected, sclera clear NECK: supple, non-tender LYMPH:  no palpable lymphadenopathy in the cervical, axillary or inguinal LUNGS: clear to auscultation and percussion with normal breathing effort HEART: regular rate & rhythm and no murmurs and no lower extremity edema Musculoskeletal: no cyanosis of digits and no clubbing  PSYCH: alert & oriented x 3, fluent speech NEURO: no focal motor/sensory deficits  LABORATORY DATA:  I have reviewed the data as listed    Latest Ref Rng & Units 01/03/2022    8:07 AM 06/15/2021   10:22 AM 03/10/2018    2:44 PM  CBC  WBC 4.0 - 10.5 K/uL 16.1  18.4  11.6   Hemoglobin 12.0 - 15.0 g/dL 14.5  13.8  13.9   Hematocrit 36.0 - 46.0 % 44.0  41.4  42.7   Platelets 150 - 400 K/uL 173  176  182        Latest Ref Rng & Units 06/15/2021   10:22 AM 01/04/2019    3:12 PM 12/31/2017    3:04 PM  CMP  Glucose 70 - 99 mg/dL 90  87  83   BUN 6 - 20 mg/dL '11  11  13   '$ Creatinine 0.44 - 1.00 mg/dL 0.63  0.75  0.68   Sodium 135 - 145 mmol/L 140  137  137   Potassium 3.5 - 5.1 mmol/L 4.3  4.7  4.2   Chloride 98 - 111 mmol/L 108  103  102   CO2 22 - 32 mmol/L '25  29  27   '$ Calcium 8.9 - 10.3 mg/dL 9.2  9.4  9.5   Total Protein 6.5 - 8.1 g/dL 7.5  7.0  7.3   Total Bilirubin 0.3 - 1.2 mg/dL 0.6  0.6  0.3   Alkaline Phos 38 - 126 U/L 32  36  32   AST 15 - 41 U/L '13  16  14   '$ ALT 0 - 44 U/L '12  14  14     '$ RADIOGRAPHIC STUDIES: No results found.  ASSESSMENT & PLAN Kaitlin Faulkner 51 y.o. female with medical history significant for CLL  who presents for a follow up visit.   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  Her white blood cell count has been elevated since at least 2019.  Fortunately she is not having any anemia or thrombocytopenia at this time --Today ordered CLL prognostic panel and supportive llabs (Zap 70, IgVH) --labs today show white blood cell 16.1, hemoglobin 14.5, MCV 85.4, and platelets of 173 --No clear indication for treatment at this time --Return to clinic in 6 months time. If counts are stable can offer q 12 month visits at that time.    # Thrombocytopenia-resolved --resolved, Plt 173 today   No orders of the defined types were placed in this encounter.   All questions were answered. The patient knows to call the clinic with any problems, questions or concerns.  A total of more than 30 minutes were spent on this encounter with face-to-face time and non-face-to-face time, including preparing to see the patient, ordering tests and/or medications, counseling the patient and coordination of care as outlined above.   Ledell Peoples, MD Department of Hematology/Oncology [place] [facility] Phone: [phone] Pager: [phone] Email: [email or web address]  01/03/2022 9:00 AM

## 2022-01-08 LAB — FISH,CLL PROGNOSTIC PANEL

## 2022-01-09 LAB — ZAP-70 PANEL (ASR)

## 2022-01-14 LAB — IGVH SOMATIC HYPERMUTATION

## 2022-07-02 ENCOUNTER — Other Ambulatory Visit: Payer: Self-pay | Admitting: Physician Assistant

## 2022-07-02 DIAGNOSIS — C9110 Chronic lymphocytic leukemia of B-cell type not having achieved remission: Secondary | ICD-10-CM

## 2022-07-03 ENCOUNTER — Inpatient Hospital Stay: Payer: BC Managed Care – PPO | Attending: Physician Assistant | Admitting: Physician Assistant

## 2022-07-03 ENCOUNTER — Other Ambulatory Visit: Payer: Self-pay

## 2022-07-03 ENCOUNTER — Inpatient Hospital Stay: Payer: BC Managed Care – PPO

## 2022-07-03 VITALS — BP 117/83 | HR 68 | Temp 97.9°F | Resp 14 | Ht 66.0 in | Wt 182.5 lb

## 2022-07-03 DIAGNOSIS — C9110 Chronic lymphocytic leukemia of B-cell type not having achieved remission: Secondary | ICD-10-CM

## 2022-07-03 DIAGNOSIS — Z880 Allergy status to penicillin: Secondary | ICD-10-CM | POA: Insufficient documentation

## 2022-07-03 DIAGNOSIS — Z90721 Acquired absence of ovaries, unilateral: Secondary | ICD-10-CM | POA: Insufficient documentation

## 2022-07-03 DIAGNOSIS — Z79899 Other long term (current) drug therapy: Secondary | ICD-10-CM | POA: Insufficient documentation

## 2022-07-03 DIAGNOSIS — Z8249 Family history of ischemic heart disease and other diseases of the circulatory system: Secondary | ICD-10-CM | POA: Diagnosis not present

## 2022-07-03 DIAGNOSIS — Z87891 Personal history of nicotine dependence: Secondary | ICD-10-CM | POA: Diagnosis not present

## 2022-07-03 LAB — CBC WITH DIFFERENTIAL (CANCER CENTER ONLY)
Abs Immature Granulocytes: 0.03 10*3/uL (ref 0.00–0.07)
Basophils Absolute: 0.1 10*3/uL (ref 0.0–0.1)
Basophils Relative: 0 %
Eosinophils Absolute: 0.1 10*3/uL (ref 0.0–0.5)
Eosinophils Relative: 1 %
HCT: 42.9 % (ref 36.0–46.0)
Hemoglobin: 14.3 g/dL (ref 12.0–15.0)
Immature Granulocytes: 0 %
Lymphocytes Relative: 70 %
Lymphs Abs: 10.5 10*3/uL — ABNORMAL HIGH (ref 0.7–4.0)
MCH: 28.3 pg (ref 26.0–34.0)
MCHC: 33.3 g/dL (ref 30.0–36.0)
MCV: 84.8 fL (ref 80.0–100.0)
Monocytes Absolute: 0.6 10*3/uL (ref 0.1–1.0)
Monocytes Relative: 4 %
Neutro Abs: 3.9 10*3/uL (ref 1.7–7.7)
Neutrophils Relative %: 25 %
Platelet Count: 180 10*3/uL (ref 150–400)
RBC: 5.06 MIL/uL (ref 3.87–5.11)
RDW: 13.3 % (ref 11.5–15.5)
WBC Count: 15.3 10*3/uL — ABNORMAL HIGH (ref 4.0–10.5)
nRBC: 0 % (ref 0.0–0.2)

## 2022-07-03 LAB — CMP (CANCER CENTER ONLY)
ALT: 14 U/L (ref 0–44)
AST: 16 U/L (ref 15–41)
Albumin: 4.4 g/dL (ref 3.5–5.0)
Alkaline Phosphatase: 42 U/L (ref 38–126)
Anion gap: 6 (ref 5–15)
BUN: 16 mg/dL (ref 6–20)
CO2: 27 mmol/L (ref 22–32)
Calcium: 9.6 mg/dL (ref 8.9–10.3)
Chloride: 107 mmol/L (ref 98–111)
Creatinine: 0.73 mg/dL (ref 0.44–1.00)
GFR, Estimated: >60 mL/min (ref >60)
Glucose, Bld: 92 mg/dL (ref 70–99)
Potassium: 4.3 mmol/L (ref 3.5–5.1)
Sodium: 140 mmol/L (ref 135–145)
Total Bilirubin: 0.7 mg/dL (ref 0.3–1.2)
Total Protein: 7.4 g/dL (ref 6.5–8.1)

## 2022-07-03 LAB — LACTATE DEHYDROGENASE: LDH: 142 U/L (ref 98–192)

## 2022-07-03 NOTE — Progress Notes (Signed)
[facility] [phone]   [phone]  PROGRESS NOTE  Patient Care Team: Lewis Moccasin, MD as PCP - General (Family Medicine)  Hematological/Oncological History # CLL Rai Stage 0  06/15/2021: establish care with Dr. Leonides Schanz   Interval History:  Kaitlin Faulkner 52 y.o. female with medical history significant for CLL who presents for a follow up visit. The patient's last visit was on 01/03/2022 at which time she established care. In the interim since the last visit she has had no major changes in her health.  On exam today Kaitlin Faulkner reports she is continuing to do well without any new or concerning symptoms. Her energy is stable and she is able to complete her baseline ADLs independently.  Her appetite is stable without any weight changes.  She denies nausea, vomiting or abdominal pain.  Her bowel habits are unchanged without recurrent episodes of diarrhea or constipation.  She denies easy bruising or signs of active bleeding.  Patient does have flashes which she suspects is secondary to premenopausal symptoms.  She denies any palpable lumps or bumps.  She denies fevers, chills, night sweats, shortness of breath, chest pain or cough.  She has no other complaints.  Rest of 10 point ROS is below.  MEDICAL HISTORY:  No past medical history on file.  SURGICAL HISTORY: Past Surgical History:  Procedure Laterality Date   OOPHORECTOMY     OVARIAN CYST REMOVAL      SOCIAL HISTORY: Social History   Socioeconomic History   Marital status: Single    Spouse name: Not on file   Number of children: Not on file   Years of education: Not on file   Highest education level: Not on file  Occupational History   Not on file  Tobacco Use   Smoking status: Former    Types: Cigarettes    Quit date: 10/31/2016    Years since quitting: 5.6   Smokeless tobacco: Never  Vaping Use   Vaping Use: Never used  Substance and Sexual Activity   Alcohol use: No    Drug use: No   Sexual activity: Yes  Other Topics Concern   Not on file  Social History Narrative   Not on file   Social Determinants of Health   Financial Resource Strain: Not on file  Food Insecurity: Not on file  Transportation Needs: Not on file  Physical Activity: Not on file  Stress: Not on file  Social Connections: Not on file  Intimate Partner Violence: Not on file    FAMILY HISTORY: Family History  Problem Relation Age of Onset   Hypertension Mother    Breast cancer Neg Hx     ALLERGIES:  is allergic to penicillins.  MEDICATIONS:  Current Outpatient Medications  Medication Sig Dispense Refill   Cholecalciferol (VITAMIN D) 2000 units tablet Take 2,000 Units by mouth daily.     ibuprofen (ADVIL) 200 MG tablet Take 200 mg by mouth every 6 (six) hours as needed for mild pain.     Multiple Vitamin (MULTIVITAMIN) tablet Take 1 tablet by mouth daily.     valACYclovir (VALTREX) 1000 MG tablet 2 tabs x1 dose and repeat in 12 hrs at onset of cold sores. 20 tablet 3   vitamin B-12 (CYANOCOBALAMIN) 1000 MCG tablet Take 1,000 mcg by mouth daily.     No current facility-administered medications for this visit.    REVIEW OF SYSTEMS:   Constitutional: ( - ) fevers, ( - )  chills , ( - )  night sweats Eyes: ( - ) blurriness of vision, ( - ) double vision, ( - ) watery eyes Ears, nose, mouth, throat, and face: ( - ) mucositis, ( - ) sore throat Respiratory: ( - ) cough, ( - ) dyspnea, ( - ) wheezes Cardiovascular: ( - ) palpitation, ( - ) chest discomfort, ( - ) lower extremity swelling Gastrointestinal:  ( - ) nausea, ( - ) heartburn, ( - ) change in bowel habits Skin: ( - ) abnormal skin rashes Lymphatics: ( - ) new lymphadenopathy, ( - ) easy bruising Neurological: ( - ) numbness, ( - ) tingling, ( - ) new weaknesses Behavioral/Psych: ( - ) mood change, ( - ) new changes  All other systems were reviewed with the patient and are negative.  PHYSICAL  EXAMINATION:  Vitals:   07/03/22 0845  BP: 117/83  Pulse: 68  Resp: 14  Temp: 97.9 F (36.6 C)  SpO2: 100%   Filed Weights   07/03/22 0845  Weight: 182 lb 8 oz (82.8 kg)    GENERAL: Well-appearing middle-age Caucasian female alert, no distress and comfortable SKIN: skin color, texture, turgor are normal, no rashes or significant lesions EYES: conjunctiva are pink and non-injected, sclera clear NECK: supple, non-tender LYMPH:  no palpable lymphadenopathy in the cervical, axillary or inguinal LUNGS: clear to auscultation and percussion with normal breathing effort HEART: regular rate & rhythm and no murmurs and no lower extremity edema Musculoskeletal: no cyanosis of digits and no clubbing  PSYCH: alert & oriented x 3, fluent speech NEURO: no focal motor/sensory deficits  LABORATORY DATA:  I have reviewed the data as listed    Latest Ref Rng & Units 07/03/2022    8:10 AM 01/03/2022    8:07 AM 06/15/2021   10:22 AM  CBC  WBC 4.0 - 10.5 K/uL 15.3  16.1  18.4   Hemoglobin 12.0 - 15.0 g/dL 08.6  57.8  46.9   Hematocrit 36.0 - 46.0 % 42.9  44.0  41.4   Platelets 150 - 400 K/uL 180  173  176        Latest Ref Rng & Units 07/03/2022    8:10 AM 01/03/2022    8:07 AM 06/15/2021   10:22 AM  CMP  Glucose 70 - 99 mg/dL 92  97  90   BUN 6 - 20 mg/dL 16  11  11    Creatinine 0.44 - 1.00 mg/dL 6.29  5.28  4.13   Sodium 135 - 145 mmol/L 140  140  140   Potassium 3.5 - 5.1 mmol/L 4.3  4.6  4.3   Chloride 98 - 111 mmol/L 107  105  108   CO2 22 - 32 mmol/L 27  30  25    Calcium 8.9 - 10.3 mg/dL 9.6  9.7  9.2   Total Protein 6.5 - 8.1 g/dL 7.4  7.3  7.5   Total Bilirubin 0.3 - 1.2 mg/dL 0.7  0.5  0.6   Alkaline Phos 38 - 126 U/L 42  48  32   AST 15 - 41 U/L 16  18  13    ALT 0 - 44 U/L 14  21  12      RADIOGRAPHIC STUDIES: No results found.  ASSESSMENT & PLAN Kaitlin Faulkner 52 y.o. female with medical history significant for CLL who presents for a follow up visit.   # CLL  Rai Stage 0 -- Findings at this time are most consistent with chronic lymphocytic leukemia.  Her white blood  cell count has been elevated since at least 2019.  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 No evidence of trisomy 12(+12), p53 deletion or amplification or ATM deletion. 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 If counts are stable can offer q 12 month visits at that time.    # Thrombocytopenia-resolved --resolved, Plt 180 today   No orders of the defined types were placed in this encounter.   All questions were answered. The patient knows to call the clinic with any problems, questions or concerns.  I have spent a total of 25 minutes minutes of face-to-face and non-face-to-face time, preparing to see the patient, performing a medically appropriate examination, counseling and educating the patient,documenting clinical information in the electronic health record,and care coordination.   Georga Kaufmann PA-C Dept of Hematology and Oncology [facility] Phone: [phone]   07/03/2022 9:14 AM

## 2023-01-02 ENCOUNTER — Inpatient Hospital Stay: Payer: BC Managed Care – PPO | Attending: Hematology and Oncology

## 2023-01-02 ENCOUNTER — Inpatient Hospital Stay: Payer: BC Managed Care – PPO | Admitting: Hematology and Oncology

## 2023-01-02 ENCOUNTER — Other Ambulatory Visit: Payer: Self-pay | Admitting: Hematology and Oncology

## 2023-01-02 ENCOUNTER — Telehealth: Payer: Self-pay

## 2023-01-02 DIAGNOSIS — C9110 Chronic lymphocytic leukemia of B-cell type not having achieved remission: Secondary | ICD-10-CM

## 2023-01-02 NOTE — Telephone Encounter (Signed)
Called pt to remind her of her appt earlier today. Pt informed that this RN would send scheduling a message to get her rescheduled at another date/time. Pt verbalized understanding.

## 2023-01-02 NOTE — Progress Notes (Signed)
[facility] Telephone:(336) [phone]   Fax:([phone]  PROGRESS NOTE  Patient Care Team: Lewis Moccasin, MD as PCP - General (Family Medicine)  Hematological/Oncological History # CLL Rai Stage 0  06/15/2021: establish care with Dr. Leonides Schanz   Interval History:  Kaitlin Faulkner 52 y.o. female with medical history significant for CLL who presents for a follow up visit. The patient's last visit was on 07/03/2022. In the interim since the last visit she has had no major changes in her health.  On exam today Kaitlin Faulkner reports ***  She denies fevers, chills, night sweats, shortness of breath, chest pain or cough.  She has no other complaints.  Rest of 10 point ROS is below.  MEDICAL HISTORY:  No past medical history on file.  SURGICAL HISTORY: Past Surgical History:  Procedure Laterality Date   OOPHORECTOMY     OVARIAN CYST REMOVAL      SOCIAL HISTORY: Social History   Socioeconomic History   Marital status: Single    Spouse name: Not on file   Number of children: Not on file   Years of education: Not on file   Highest education level: Not on file  Occupational History   Not on file  Tobacco Use   Smoking status: Former    Current packs/day: 0.00    Types: Cigarettes    Quit date: 10/31/2016    Years since quitting: 6.1   Smokeless tobacco: Never  Vaping Use   Vaping status: Never Used  Substance and Sexual Activity   Alcohol use: No   Drug use: No   Sexual activity: Yes  Other Topics Concern   Not on file  Social History Narrative   Not on file   Social Determinants of Health   Financial Resource Strain: Not on file  Food Insecurity: Not on file  Transportation Needs: Not on file  Physical Activity: Not on file  Stress: Not on file  Social Connections: Not on file  Intimate Partner Violence: Not on file    FAMILY HISTORY: Family History  Problem Relation Age of Onset   Hypertension Mother    Breast cancer Neg Hx      ALLERGIES:  is allergic to penicillins.  MEDICATIONS:  Current Outpatient Medications  Medication Sig Dispense Refill   Cholecalciferol (VITAMIN D) 2000 units tablet Take 2,000 Units by mouth daily.     ibuprofen (ADVIL) 200 MG tablet Take 200 mg by mouth every 6 (six) hours as needed for mild pain.     Multiple Vitamin (MULTIVITAMIN) tablet Take 1 tablet by mouth daily.     valACYclovir (VALTREX) 1000 MG tablet 2 tabs x1 dose and repeat in 12 hrs at onset of cold sores. 20 tablet 3   vitamin B-12 (CYANOCOBALAMIN) 1000 MCG tablet Take 1,000 mcg by mouth daily.     No current facility-administered medications for this visit.    REVIEW OF SYSTEMS:   Constitutional: ( - ) fevers, ( - )  chills , ( - ) night sweats Eyes: ( - ) blurriness of vision, ( - ) double vision, ( - ) watery eyes Ears, nose, mouth, throat, and face: ( - ) mucositis, ( - ) sore throat Respiratory: ( - ) cough, ( - ) dyspnea, ( - ) wheezes Cardiovascular: ( - ) palpitation, ( - ) chest discomfort, ( - ) lower extremity swelling Gastrointestinal:  ( - ) nausea, ( - ) heartburn, ( - ) change in bowel habits Skin: ( - )  abnormal skin rashes Lymphatics: ( - ) new lymphadenopathy, ( - ) easy bruising Neurological: ( - ) numbness, ( - ) tingling, ( - ) new weaknesses Behavioral/Psych: ( - ) mood change, ( - ) new changes  All other systems were reviewed with the patient and are negative.  PHYSICAL EXAMINATION:  There were no vitals filed for this visit.  There were no vitals filed for this visit.   GENERAL: Well-appearing middle-age Caucasian female alert, no distress and comfortable SKIN: skin color, texture, turgor are normal, no rashes or significant lesions EYES: conjunctiva are pink and non-injected, sclera clear NECK: supple, non-tender LYMPH:  no palpable lymphadenopathy in the cervical, axillary or inguinal LUNGS: clear to auscultation and percussion with normal breathing effort HEART: regular rate &  rhythm and no murmurs and no lower extremity edema Musculoskeletal: no cyanosis of digits and no clubbing  PSYCH: alert & oriented x 3, fluent speech NEURO: no focal motor/sensory deficits  LABORATORY DATA:  I have reviewed the data as listed    Latest Ref Rng & Units 07/03/2022    8:10 AM 01/03/2022    8:07 AM 06/15/2021   10:22 AM  CBC  WBC 4.0 - 10.5 K/uL 15.3  16.1  18.4   Hemoglobin 12.0 - 15.0 g/dL 16.1  09.6  04.5   Hematocrit 36.0 - 46.0 % 42.9  44.0  41.4   Platelets 150 - 400 K/uL 180  173  176        Latest Ref Rng & Units 07/03/2022    8:10 AM 01/03/2022    8:07 AM 06/15/2021   10:22 AM  CMP  Glucose 70 - 99 mg/dL 92  97  90   BUN 6 - 20 mg/dL 16  11  11    Creatinine 0.44 - 1.00 mg/dL 4.09  8.11  9.14   Sodium 135 - 145 mmol/L 140  140  140   Potassium 3.5 - 5.1 mmol/L 4.3  4.6  4.3   Chloride 98 - 111 mmol/L 107  105  108   CO2 22 - 32 mmol/L 27  30  25    Calcium 8.9 - 10.3 mg/dL 9.6  9.7  9.2   Total Protein 6.5 - 8.1 g/dL 7.4  7.3  7.5   Total Bilirubin 0.3 - 1.2 mg/dL 0.7  0.5  0.6   Alkaline Phos 38 - 126 U/L 42  48  32   AST 15 - 41 U/L 16  18  13    ALT 0 - 44 U/L 14  21  12      RADIOGRAPHIC STUDIES: No results found.  ASSESSMENT & PLAN Kaitlin Faulkner 52 y.o. female with medical history significant for CLL who presents for a follow up visit.   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  Her white blood cell count has been elevated since at least 2019.  Fortunately she is not having any anemia or thrombocytopenia at this time --Prognostic panel: Evidence of mono-allelic deletion of N82N562. No evidence of trisomy 12(+12), p53 deletion or amplification or ATM deletion. 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   All questions were answered. The patient knows to call the clinic with  any problems, questions or concerns.  I have spent a total of 25 minutes minutes of face-to-face and non-face-to-face time, preparing to see the patient, performing a medically appropriate examination, counseling and educating the patient,documenting clinical information in the electronic health record,and care coordination.   Ulysees Barns, MD Department of Hematology/Oncology [facility] at [facility] Phone: [phone] Pager: [phone] Email: [email or web address]    01/02/2023 7:34 AM

## 2023-01-03 ENCOUNTER — Telehealth: Payer: Self-pay | Admitting: Hematology and Oncology

## 2023-01-06 ENCOUNTER — Telehealth: Payer: Self-pay | Admitting: Hematology and Oncology

## 2023-01-07 ENCOUNTER — Inpatient Hospital Stay: Payer: BC Managed Care – PPO | Admitting: Hematology and Oncology

## 2023-01-07 ENCOUNTER — Inpatient Hospital Stay: Payer: BC Managed Care – PPO

## 2023-01-19 NOTE — Progress Notes (Signed)
[facility] Telephone:(336) [phone]   Fax:(336) [phone]  PROGRESS NOTE  Patient Care Team: Lewis Moccasin, MD as PCP - General (Family Medicine)  Hematological/Oncological History # CLL Rai Stage 0  06/15/2021: establish care with Dr. Leonides Schanz   Interval History:  Kaitlin Faulkner 52 y.o. female with medical history significant for CLL who presents for a follow up visit. The patient's last visit was on 07/03/2022. In the interim since the last visit she has had no major changes in her health.  On exam today Kaitlin Faulkner ([id number]) reports she has been well overall in the Num-Zit her last visit.  She reports he does have some occasional sweats and hot flashes which she thinks are due to her menopausal changes.  She reports that she does have late sweats but none that are drenching enough to change her shirt or sheets.  She reports she just feels hot with a little bit of sweating.  She denies any bumps or lumps concerning for lymphadenopathy.  Her appetite is good and she is not having any abdominal swelling.  She is not having any unexpected weight loss or nausea, vomiting, or diarrhea.  She reports that other than her menopausal symptoms she has had no changes in her health since our last discussion.  She denies fevers, chills, night sweats, shortness of breath, chest pain or cough.  She has no other complaints.  Rest of 10 point ROS is below.  MEDICAL HISTORY:  No past medical history on file.  SURGICAL HISTORY: Past Surgical History:  Procedure Laterality Date   OOPHORECTOMY     OVARIAN CYST REMOVAL      SOCIAL HISTORY: Social History   Socioeconomic History   Marital status: Single    Spouse name: Not on file   Number of children: Not on file   Years of education: Not on file   Highest education level: Not on file  Occupational History   Not on file  Tobacco Use   Smoking status: Former    Current packs/day: 0.00    Types: Cigarettes    Quit date:  10/31/2016    Years since quitting: 6.2   Smokeless tobacco: Never  Vaping Use   Vaping status: Never Used  Substance and Sexual Activity   Alcohol use: No   Drug use: No   Sexual activity: Yes  Other Topics Concern   Not on file  Social History Narrative   Not on file   Social Determinants of Health   Financial Resource Strain: Not on file  Food Insecurity: Not on file  Transportation Needs: Not on file  Physical Activity: Not on file  Stress: Not on file  Social Connections: Not on file  Intimate Partner Violence: Not on file    FAMILY HISTORY: Family History  Problem Relation Age of Onset   Hypertension Mother    Breast cancer Neg Hx     ALLERGIES:  is allergic to penicillins.  MEDICATIONS:  Current Outpatient Medications  Medication Sig Dispense Refill   Cholecalciferol (VITAMIN D) 2000 units tablet Take 2,000 Units by mouth daily.     ibuprofen (ADVIL) 200 MG tablet Take 200 mg by mouth every 6 (six) hours as needed for mild pain.     Multiple Vitamin (MULTIVITAMIN) tablet Take 1 tablet by mouth daily.     valACYclovir (VALTREX) 1000 MG tablet 2 tabs x1 dose and repeat in 12 hrs at onset of cold sores. 20 tablet 3   vitamin B-12 (CYANOCOBALAMIN) 1000 MCG  tablet Take 1,000 mcg by mouth daily.     No current facility-administered medications for this visit.    REVIEW OF SYSTEMS:   Constitutional: ( - ) fevers, ( - )  chills , ( - ) night sweats Eyes: ( - ) blurriness of vision, ( - ) double vision, ( - ) watery eyes Ears, nose, mouth, throat, and face: ( - ) mucositis, ( - ) sore throat Respiratory: ( - ) cough, ( - ) dyspnea, ( - ) wheezes Cardiovascular: ( - ) palpitation, ( - ) chest discomfort, ( - ) lower extremity swelling Gastrointestinal:  ( - ) nausea, ( - ) heartburn, ( - ) change in bowel habits Skin: ( - ) abnormal skin rashes Lymphatics: ( - ) new lymphadenopathy, ( - ) easy bruising Neurological: ( - ) numbness, ( - ) tingling, ( - ) new  weaknesses Behavioral/Psych: ( - ) mood change, ( - ) new changes  All other systems were reviewed with the patient and are negative.  PHYSICAL EXAMINATION:  Vitals:   01/21/23 1058  BP: (!) 122/92  Pulse: 62  Resp: 14  Temp: 97.7 F (36.5 C)  SpO2: 99%    Filed Weights   01/21/23 1058  Weight: 190 lb 3.2 oz (86.3 kg)     GENERAL: Well-appearing middle-age Caucasian female alert, no distress and comfortable SKIN: skin color, texture, turgor are normal, no rashes or significant lesions EYES: conjunctiva are pink and non-injected, sclera clear NECK: supple, non-tender LYMPH:  no palpable lymphadenopathy in the cervical, axillary or inguinal LUNGS: clear to auscultation and percussion with normal breathing effort HEART: regular rate & rhythm and no murmurs and no lower extremity edema Musculoskeletal: no cyanosis of digits and no clubbing  PSYCH: alert & oriented x 3, fluent speech NEURO: no focal motor/sensory deficits  LABORATORY DATA:  I have reviewed the data as listed    Latest Ref Rng & Units 01/21/2023   10:43 AM 07/03/2022    8:10 AM 01/03/2022    8:07 AM  CBC  WBC 4.0 - 10.5 K/uL 14.9  15.3  16.1   Hemoglobin 12.0 - 15.0 g/dL 40.9  81.1  91.4   Hematocrit 36.0 - 46.0 % 41.0  42.9  44.0   Platelets 150 - 400 K/uL 182  180  173        Latest Ref Rng & Units 01/21/2023   10:43 AM 07/03/2022    8:10 AM 01/03/2022    8:07 AM  CMP  Glucose 70 - 99 mg/dL 96  92  97   BUN 6 - 20 mg/dL 14  16  11    Creatinine 0.44 - 1.00 mg/dL 7.82  9.56  2.13   Sodium 135 - 145 mmol/L 138  140  140   Potassium 3.5 - 5.1 mmol/L 4.5  4.3  4.6   Chloride 98 - 111 mmol/L 106  107  105   CO2 22 - 32 mmol/L 28  27  30    Calcium 8.9 - 10.3 mg/dL 9.1  9.6  9.7   Total Protein 6.5 - 8.1 g/dL 6.8  7.4  7.3   Total Bilirubin <1.2 mg/dL 0.4  0.7  0.5   Alkaline Phos 38 - 126 U/L 45  42  48   AST 15 - 41 U/L 15  16  18    ALT 0 - 44 U/L 18  14  21      RADIOGRAPHIC STUDIES: No results  found.  ASSESSMENT & PLAN  Kaitlin Faulkner 52 y.o. female with medical history significant for CLL who presents for a follow up visit.   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  Her white blood cell count has been elevated since at least 2019.  Fortunately she is not having any anemia or thrombocytopenia at this time --Prognostic panel: Evidence of mono-allelic deletion of Q46N629. No evidence of trisomy 12(+12), p53 deletion or amplification or ATM deletion. 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   All questions were answered. The patient knows to call the clinic with any problems, questions or concerns.  I have spent a total of 25 minutes minutes of face-to-face and non-face-to-face time, preparing to see the patient, performing a medically appropriate examination, counseling and educating the patient,documenting clinical information in the electronic health record,and care coordination.   Ulysees Barns, MD Department of Hematology/Oncology [facility] at [facility] Phone: [phone] Pager: [phone] Email: [email or web address]    01/21/2023 3:42 PM

## 2023-01-21 ENCOUNTER — Inpatient Hospital Stay: Payer: BC Managed Care – PPO | Admitting: Hematology and Oncology

## 2023-01-21 ENCOUNTER — Inpatient Hospital Stay: Payer: BC Managed Care – PPO | Attending: Hematology and Oncology

## 2023-01-21 VITALS — BP 122/92 | HR 62 | Temp 97.7°F | Resp 14 | Wt 190.2 lb

## 2023-01-21 DIAGNOSIS — C9110 Chronic lymphocytic leukemia of B-cell type not having achieved remission: Secondary | ICD-10-CM

## 2023-01-21 DIAGNOSIS — Z880 Allergy status to penicillin: Secondary | ICD-10-CM | POA: Insufficient documentation

## 2023-01-21 DIAGNOSIS — R232 Flushing: Secondary | ICD-10-CM | POA: Diagnosis not present

## 2023-01-21 DIAGNOSIS — Z8249 Family history of ischemic heart disease and other diseases of the circulatory system: Secondary | ICD-10-CM | POA: Diagnosis not present

## 2023-01-21 DIAGNOSIS — Z87891 Personal history of nicotine dependence: Secondary | ICD-10-CM | POA: Insufficient documentation

## 2023-01-21 DIAGNOSIS — D72820 Lymphocytosis (symptomatic): Secondary | ICD-10-CM

## 2023-01-21 DIAGNOSIS — Z79899 Other long term (current) drug therapy: Secondary | ICD-10-CM | POA: Insufficient documentation

## 2023-01-21 DIAGNOSIS — D696 Thrombocytopenia, unspecified: Secondary | ICD-10-CM | POA: Diagnosis not present

## 2023-01-21 DIAGNOSIS — Z90721 Acquired absence of ovaries, unilateral: Secondary | ICD-10-CM | POA: Insufficient documentation

## 2023-01-21 LAB — CBC WITH DIFFERENTIAL (CANCER CENTER ONLY)
Abs Immature Granulocytes: 0.02 10*3/uL (ref 0.00–0.07)
Basophils Absolute: 0 10*3/uL (ref 0.0–0.1)
Basophils Relative: 0 %
Eosinophils Absolute: 0.1 10*3/uL (ref 0.0–0.5)
Eosinophils Relative: 1 %
HCT: 41 % (ref 36.0–46.0)
Hemoglobin: 13.6 g/dL (ref 12.0–15.0)
Immature Granulocytes: 0 %
Lymphocytes Relative: 71 %
Lymphs Abs: 10.6 10*3/uL — ABNORMAL HIGH (ref 0.7–4.0)
MCH: 28.3 pg (ref 26.0–34.0)
MCHC: 33.2 g/dL (ref 30.0–36.0)
MCV: 85.2 fL (ref 80.0–100.0)
Monocytes Absolute: 0.6 10*3/uL (ref 0.1–1.0)
Monocytes Relative: 4 %
Neutro Abs: 3.5 10*3/uL (ref 1.7–7.7)
Neutrophils Relative %: 24 %
Platelet Count: 182 10*3/uL (ref 150–400)
RBC: 4.81 MIL/uL (ref 3.87–5.11)
RDW: 13.6 % (ref 11.5–15.5)
WBC Count: 14.9 10*3/uL — ABNORMAL HIGH (ref 4.0–10.5)
nRBC: 0 % (ref 0.0–0.2)

## 2023-01-21 LAB — CMP (CANCER CENTER ONLY)
ALT: 18 U/L (ref 0–44)
AST: 15 U/L (ref 15–41)
Albumin: 4.1 g/dL (ref 3.5–5.0)
Alkaline Phosphatase: 45 U/L (ref 38–126)
Anion gap: 4 — ABNORMAL LOW (ref 5–15)
BUN: 14 mg/dL (ref 6–20)
CO2: 28 mmol/L (ref 22–32)
Calcium: 9.1 mg/dL (ref 8.9–10.3)
Chloride: 106 mmol/L (ref 98–111)
Creatinine: 0.67 mg/dL (ref 0.44–1.00)
GFR, Estimated: >60 mL/min (ref >60)
Glucose, Bld: 96 mg/dL (ref 70–99)
Potassium: 4.5 mmol/L (ref 3.5–5.1)
Sodium: 138 mmol/L (ref 135–145)
Total Bilirubin: 0.4 mg/dL (ref <1.2)
Total Protein: 6.8 g/dL (ref 6.5–8.1)

## 2023-01-21 LAB — LACTATE DEHYDROGENASE: LDH: 121 U/L (ref 98–192)

## 2023-01-29 IMAGING — US US ABDOMEN COMPLETE
1 series · 15 of 25 positions shown · non-contrast
Comparison: None Available.

CLINICAL DATA: Evaluate for liver or spleen disease.
Thrombocytopenia. Lymphocytosis.

EXAM:
ABDOMEN ULTRASOUND COMPLETE

[Series 1: us abdomen complete mc & wl · 15 of 85 slices shown]
[im 1/85]
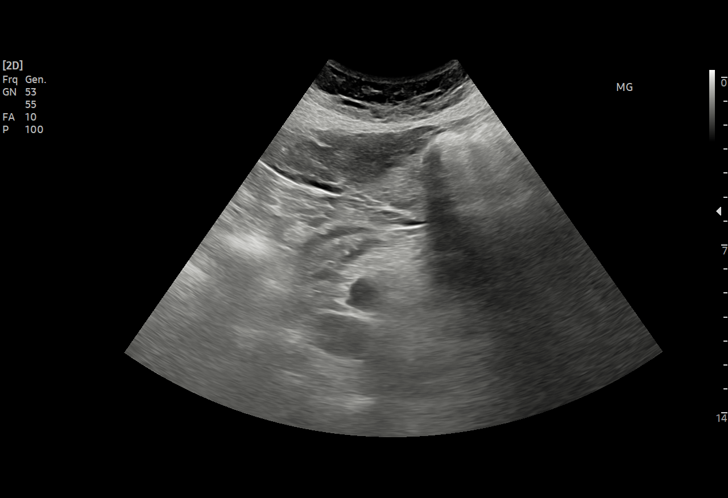
[im 8/85]
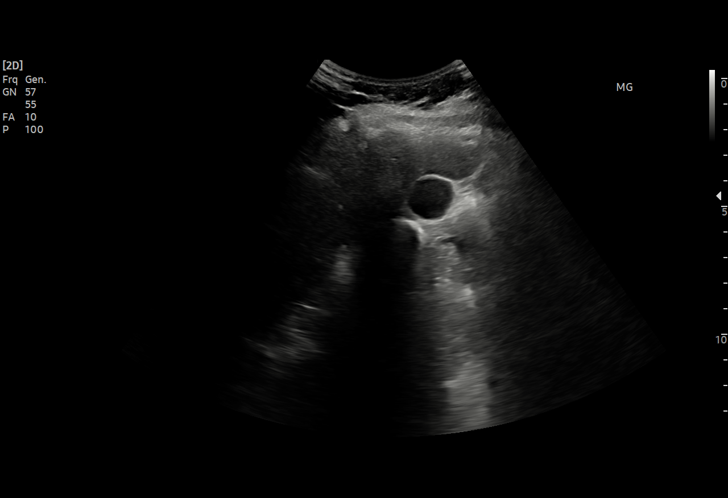
[im 15/85]
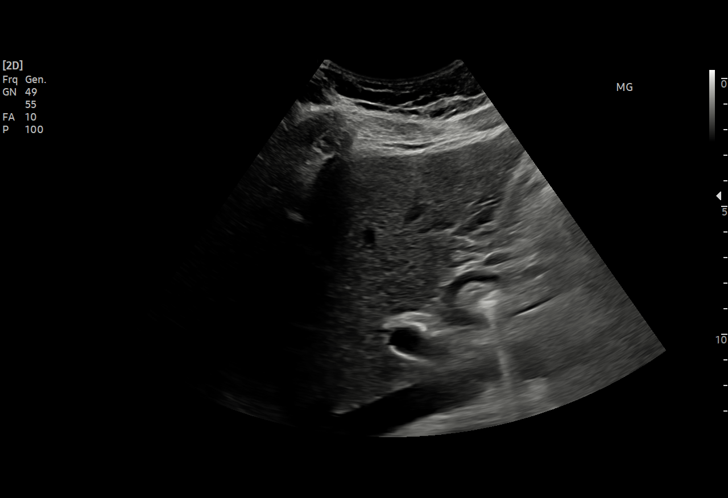
[im 18/85]
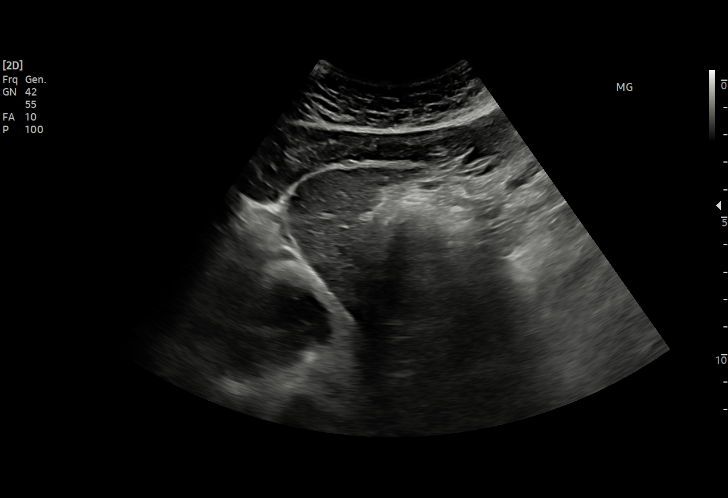
[im 25/85]
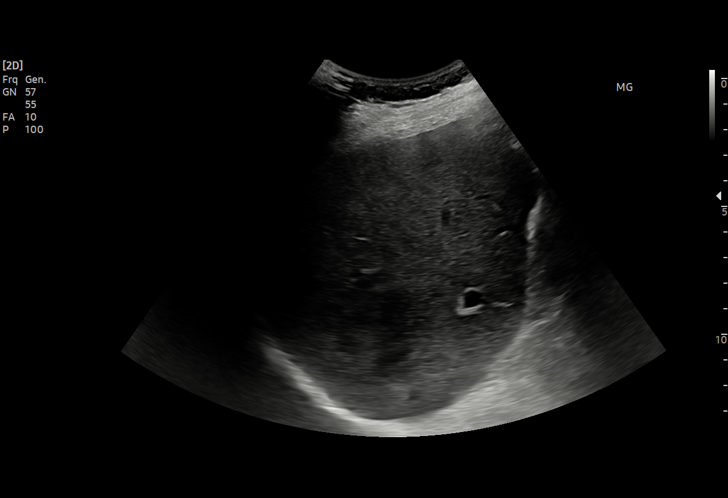
[im 32/85]
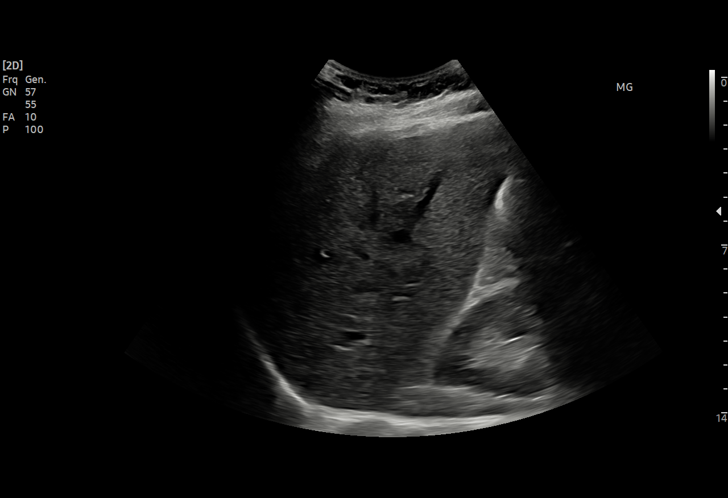
[im 36/85]
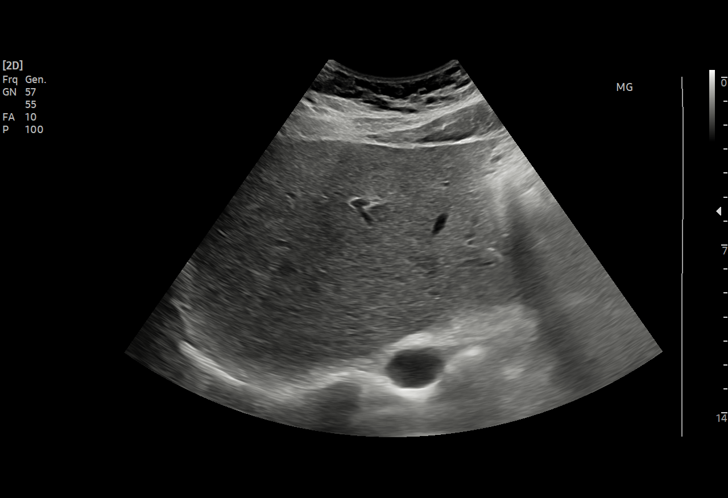
[im 43/85]
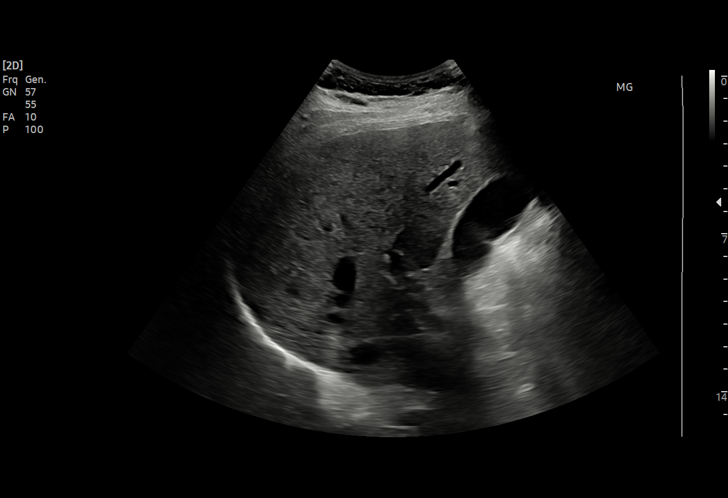
[im 50/85]
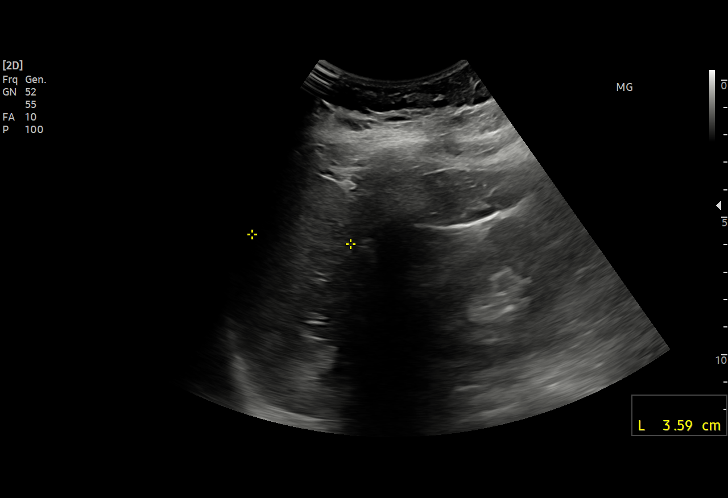
[im 53/85]
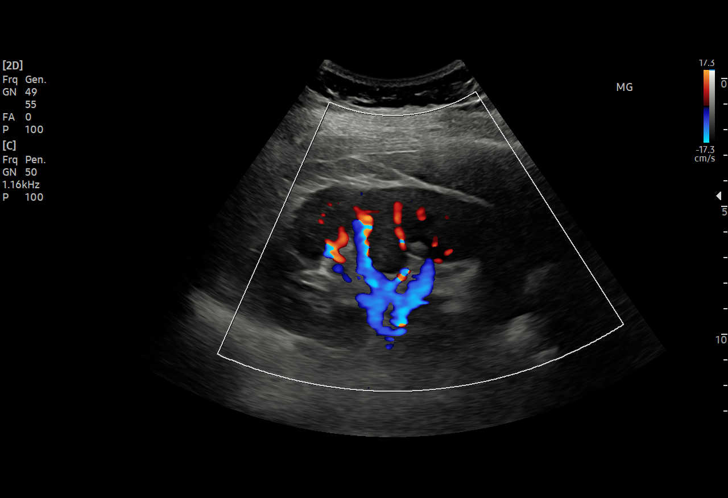
[im 60/85]
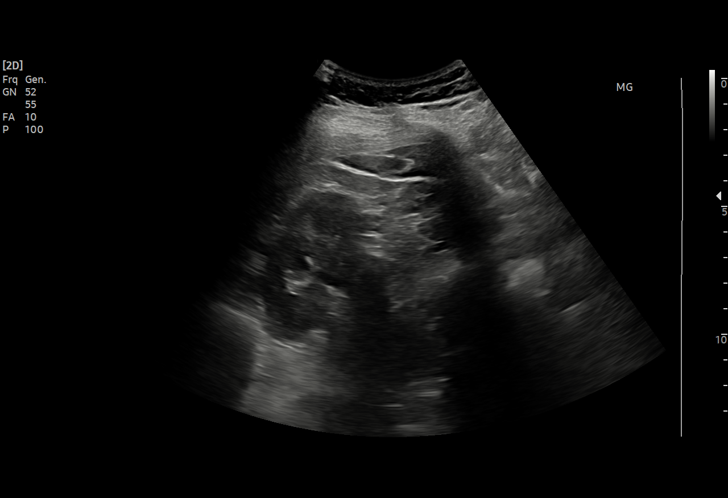
[im 67/85]
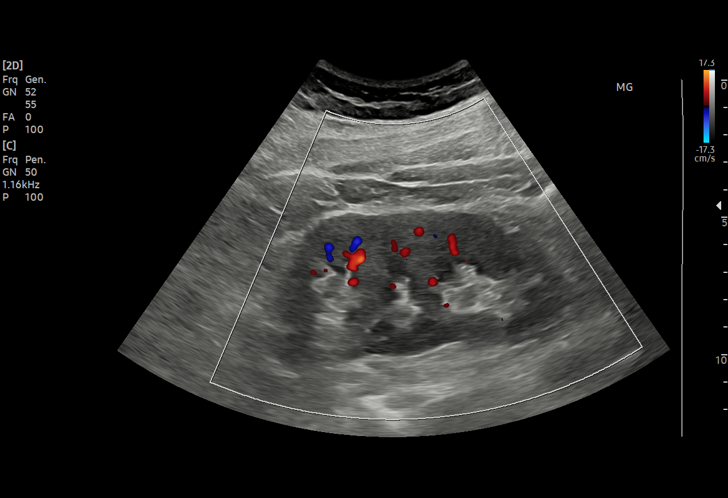
[im 71/85]
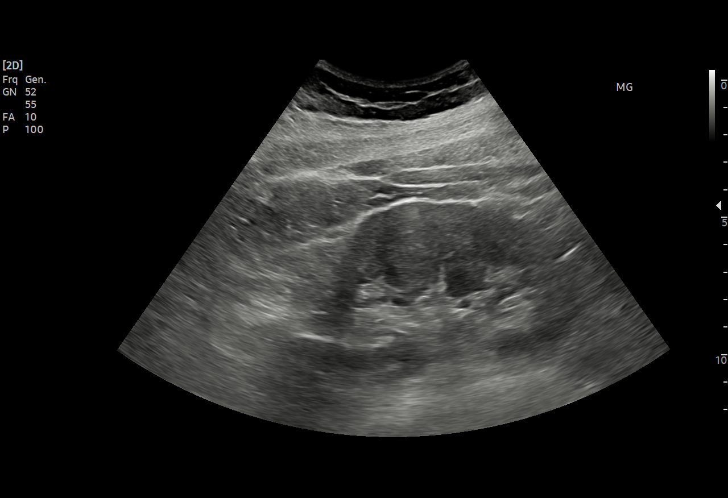
[im 78/85]
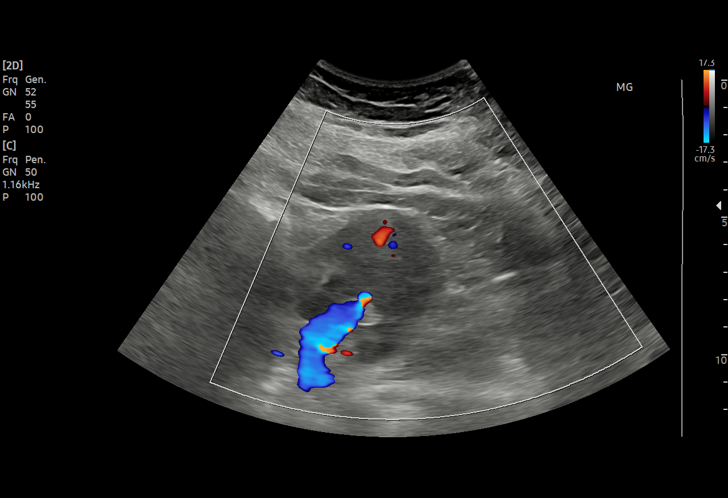
[im 85/85]
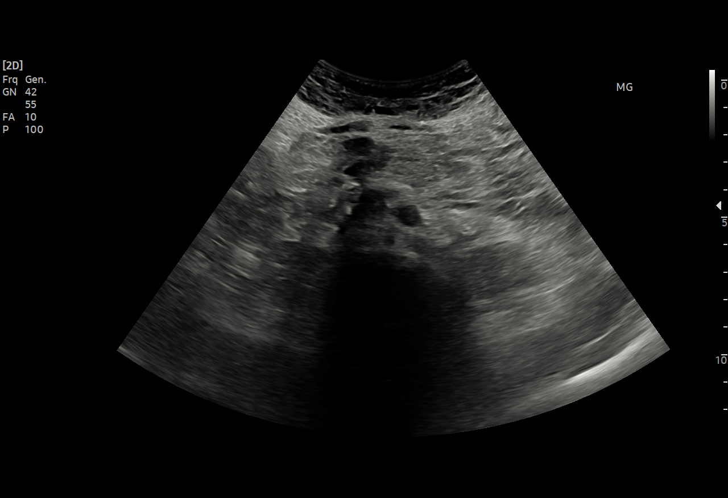

[15 of 25 positions shown; findings below may reference images not displayed]

FINDINGS: Gallbladder: No gallstones or wall thickening visualized. No
sonographic Murphy sign noted by sonographer.

Common bile duct: Diameter: 3.4 mm

Liver: No focal lesion identified. Within normal limits in
parenchymal echogenicity. Portal vein is patent on color Doppler
imaging with normal direction of blood flow towards the liver.

IVC: No abnormality visualized.

Pancreas: Limited evaluation due to shadowing bowel gas. Visualized
portions of the pancreatic head are normal.

Spleen: Size and appearance within normal limits.

Right Kidney: Length: 11.6 cm. Echogenicity within normal limits. No
mass or hydronephrosis visualized.

Left Kidney: Length: 10.5 cm. Echogenicity within normal limits. No
mass or hydronephrosis visualized.

Abdominal aorta: No aneurysm visualized.

Other findings: None.
IMPRESSION: 1. The liver and spleen are unremarkable.
2. Limited evaluation of the pancreas without obvious abnormality in
visualized portions.
3. No other abnormalities.

## 2023-06-06 ENCOUNTER — Other Ambulatory Visit: Payer: Self-pay | Admitting: Family Medicine

## 2023-06-06 DIAGNOSIS — Z1231 Encounter for screening mammogram for malignant neoplasm of breast: Secondary | ICD-10-CM

## 2023-06-26 ENCOUNTER — Ambulatory Visit
Admission: RE | Admit: 2023-06-26 | Discharge: 2023-06-26 | Disposition: A | Discharge status: Home / Self Care | Payer: Self-pay | Source: Ambulatory Visit | Attending: Family Medicine | Admitting: Family Medicine

## 2023-06-26 DIAGNOSIS — Z1231 Encounter for screening mammogram for malignant neoplasm of breast: Secondary | ICD-10-CM

## 2024-01-20 ENCOUNTER — Other Ambulatory Visit: Payer: Self-pay | Admitting: Hematology and Oncology

## 2024-01-20 DIAGNOSIS — C9110 Chronic lymphocytic leukemia of B-cell type not having achieved remission: Secondary | ICD-10-CM

## 2024-01-20 NOTE — Progress Notes (Unsigned)
 [facility] Telephone:(336) [phone]   Fax:(336) [phone]  PROGRESS NOTE  Patient Care Team: Kaitlin Almarie SAUNDERS, MD as PCP - General (Family Medicine)  Hematological/Oncological History # CLL Rai Stage 0  06/15/2021: establish care with Dr. Federico   Interval History:  Kaitlin Faulkner 53 y.o. female with medical history significant for CLL who presents for a follow up visit. The patient's last visit was on 07/03/2022. In the interim since the last visit she has had no major changes in her health.  On exam today Kaitlin Faulkner ([id number]) reports she has been well overall in the Num-Zit her last visit.  She reports he does have some occasional sweats and hot flashes which she thinks are due to her menopausal changes.  She reports that she does have late sweats but none that are drenching enough to change her shirt or sheets.  She reports she just feels hot with a little bit of sweating.  She denies any bumps or lumps concerning for lymphadenopathy.  Her appetite is good and she is not having any abdominal swelling.  She is not having any unexpected weight loss or nausea, vomiting, or diarrhea.  She reports that other than her menopausal symptoms she has had no changes in her health since our last discussion.  She denies fevers, chills, night sweats, shortness of breath, chest pain or cough.  She has no other complaints.  Rest of 10 point ROS is below.  MEDICAL HISTORY:  No past medical history on file.  SURGICAL HISTORY: Past Surgical History:  Procedure Laterality Date   OOPHORECTOMY     OVARIAN CYST REMOVAL      SOCIAL HISTORY: Social History   Socioeconomic History   Marital status: Single    Spouse name: Not on file   Number of children: Not on file   Years of education: Not on file   Highest education level: Not on file  Occupational History   Not on file  Tobacco Use   Smoking status: Former    Current packs/day: 0.00    Types: Cigarettes    Quit date:  10/31/2016    Years since quitting: 7.2   Smokeless tobacco: Never  Vaping Use   Vaping status: Never Used  Substance and Sexual Activity   Alcohol use: No   Drug use: No   Sexual activity: Yes  Other Topics Concern   Not on file  Social History Narrative   Not on file   Social Drivers of Health   Financial Resource Strain: Not on file  Food Insecurity: Not on file  Transportation Needs: Not on file  Physical Activity: Not on file  Stress: Not on file  Social Connections: Not on file  Intimate Partner Violence: Not on file    FAMILY HISTORY: Family History  Problem Relation Age of Onset   Hypertension Mother    Breast cancer Neg Hx     ALLERGIES:  is allergic to penicillins.  MEDICATIONS:  Current Outpatient Medications  Medication Sig Dispense Refill   Cholecalciferol (VITAMIN D ) 2000 units tablet Take 2,000 Units by mouth daily.     ibuprofen (ADVIL) 200 MG tablet Take 200 mg by mouth every 6 (six) hours as needed for mild pain.     Multiple Vitamin (MULTIVITAMIN) tablet Take 1 tablet by mouth daily.     valACYclovir  (VALTREX ) 1000 MG tablet 2 tabs x1 dose and repeat in 12 hrs at onset of cold sores. 20 tablet 3   vitamin B-12 (CYANOCOBALAMIN) 1000 MCG  tablet Take 1,000 mcg by mouth daily.     No current facility-administered medications for this visit.    REVIEW OF SYSTEMS:   Constitutional: ( - ) fevers, ( - )  chills , ( - ) night sweats Eyes: ( - ) blurriness of vision, ( - ) double vision, ( - ) watery eyes Ears, nose, mouth, throat, and face: ( - ) mucositis, ( - ) sore throat Respiratory: ( - ) cough, ( - ) dyspnea, ( - ) wheezes Cardiovascular: ( - ) palpitation, ( - ) chest discomfort, ( - ) lower extremity swelling Gastrointestinal:  ( - ) nausea, ( - ) heartburn, ( - ) change in bowel habits Skin: ( - ) abnormal skin rashes Lymphatics: ( - ) new lymphadenopathy, ( - ) easy bruising Neurological: ( - ) numbness, ( - ) tingling, ( - ) new  weaknesses Behavioral/Psych: ( - ) mood change, ( - ) new changes  All other systems were reviewed with the patient and are negative.  PHYSICAL EXAMINATION:  There were no vitals filed for this visit.   There were no vitals filed for this visit.    GENERAL: Well-appearing middle-age Caucasian female alert, no distress and comfortable SKIN: skin color, texture, turgor are normal, no rashes or significant lesions EYES: conjunctiva are pink and non-injected, sclera clear NECK: supple, non-tender LYMPH:  no palpable lymphadenopathy in the cervical, axillary or inguinal LUNGS: clear to auscultation and percussion with normal breathing effort HEART: regular rate & rhythm and no murmurs and no lower extremity edema Musculoskeletal: no cyanosis of digits and no clubbing  PSYCH: alert & oriented x 3, fluent speech NEURO: no focal motor/sensory deficits  LABORATORY DATA:  I have reviewed the data as listed    Latest Ref Rng & Units 01/21/2023   10:43 AM 07/03/2022    8:10 AM 01/03/2022    8:07 AM  CBC  WBC 4.0 - 10.5 K/uL 14.9  15.3  16.1   Hemoglobin 12.0 - 15.0 g/dL 86.3  85.6  85.4   Hematocrit 36.0 - 46.0 % 41.0  42.9  44.0   Platelets 150 - 400 K/uL 182  180  173        Latest Ref Rng & Units 01/21/2023   10:43 AM 07/03/2022    8:10 AM 01/03/2022    8:07 AM  CMP  Glucose 70 - 99 mg/dL 96  92  97   BUN 6 - 20 mg/dL 14  16  11    Creatinine 0.44 - 1.00 mg/dL 9.32  9.26  9.33   Sodium 135 - 145 mmol/L 138  140  140   Potassium 3.5 - 5.1 mmol/L 4.5  4.3  4.6   Chloride 98 - 111 mmol/L 106  107  105   CO2 22 - 32 mmol/L 28  27  30    Calcium 8.9 - 10.3 mg/dL 9.1  9.6  9.7   Total Protein 6.5 - 8.1 g/dL 6.8  7.4  7.3   Total Bilirubin <1.2 mg/dL 0.4  0.7  0.5   Alkaline Phos 38 - 126 U/L 45  42  48   AST 15 - 41 U/L 15  16  18    ALT 0 - 44 U/L 18  14  21      RADIOGRAPHIC STUDIES: No results found.  ASSESSMENT & PLAN Kaitlin Faulkner 53 y.o. female with medical history  significant for CLL who presents for a follow up visit.   # CLL Rai Stage  0 -- Findings at this time are most consistent with chronic lymphocytic leukemia.  Her white blood cell count has been elevated since at least 2019.  Fortunately she is not having any anemia or thrombocytopenia at this time --Prognostic panel: Evidence of mono-allelic deletion of D13S319. No evidence of trisomy 12(+12), p53 deletion or amplification or ATM deletion. 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   All questions were answered. The patient knows to call the clinic with any problems, questions or concerns.  I have spent a total of 25 minutes minutes of face-to-face and non-face-to-face time, preparing to see the patient, performing a medically appropriate examination, counseling and educating the patient,documenting clinical information in the electronic health record,and care coordination.   Norleen IVAR Kidney, MD Department of Hematology/Oncology [facility] at [facility] Phone: [phone] Pager: [phone] Email: [email or web address]    01/20/2024 9:31 PM

## 2024-01-21 ENCOUNTER — Inpatient Hospital Stay: Payer: BC Managed Care – PPO | Attending: Hematology and Oncology | Admitting: Hematology and Oncology

## 2024-01-21 ENCOUNTER — Inpatient Hospital Stay: Payer: BC Managed Care – PPO

## 2024-01-21 VITALS — BP 129/90 | HR 68 | Temp 98.0°F | Resp 13 | Wt 173.0 lb

## 2024-01-21 DIAGNOSIS — C9110 Chronic lymphocytic leukemia of B-cell type not having achieved remission: Secondary | ICD-10-CM | POA: Insufficient documentation

## 2024-01-21 DIAGNOSIS — Z90721 Acquired absence of ovaries, unilateral: Secondary | ICD-10-CM | POA: Diagnosis not present

## 2024-01-21 DIAGNOSIS — Z880 Allergy status to penicillin: Secondary | ICD-10-CM | POA: Insufficient documentation

## 2024-01-21 DIAGNOSIS — Z8249 Family history of ischemic heart disease and other diseases of the circulatory system: Secondary | ICD-10-CM | POA: Diagnosis not present

## 2024-01-21 DIAGNOSIS — Z79899 Other long term (current) drug therapy: Secondary | ICD-10-CM | POA: Insufficient documentation

## 2024-01-21 DIAGNOSIS — Z87891 Personal history of nicotine dependence: Secondary | ICD-10-CM | POA: Diagnosis not present

## 2024-01-21 DIAGNOSIS — D72820 Lymphocytosis (symptomatic): Secondary | ICD-10-CM

## 2024-01-21 LAB — CMP (CANCER CENTER ONLY)
ALT: 19 U/L (ref 0–44)
AST: 20 U/L (ref 15–41)
Albumin: 4.5 g/dL (ref 3.5–5.0)
Alkaline Phosphatase: 43 U/L (ref 38–126)
Anion gap: 5 (ref 5–15)
BUN: 14 mg/dL (ref 6–20)
CO2: 29 mmol/L (ref 22–32)
Calcium: 9.8 mg/dL (ref 8.9–10.3)
Chloride: 105 mmol/L (ref 98–111)
Creatinine: 0.66 mg/dL (ref 0.44–1.00)
GFR, Estimated: >60 mL/min (ref >60)
Glucose, Bld: 81 mg/dL (ref 70–99)
Potassium: 4.4 mmol/L (ref 3.5–5.1)
Sodium: 139 mmol/L (ref 135–145)
Total Bilirubin: 0.6 mg/dL (ref 0.0–1.2)
Total Protein: 7.4 g/dL (ref 6.5–8.1)

## 2024-01-21 LAB — CBC WITH DIFFERENTIAL (CANCER CENTER ONLY)
Abs Immature Granulocytes: 0.03 K/uL (ref 0.00–0.07)
Basophils Absolute: 0.1 K/uL (ref 0.0–0.1)
Basophils Relative: 0 %
Eosinophils Absolute: 0.1 K/uL (ref 0.0–0.5)
Eosinophils Relative: 0 %
HCT: 41.9 % (ref 36.0–46.0)
Hemoglobin: 13.9 g/dL (ref 12.0–15.0)
Immature Granulocytes: 0 %
Lymphocytes Relative: 73 %
Lymphs Abs: 13.7 K/uL — ABNORMAL HIGH (ref 0.7–4.0)
MCH: 27.9 pg (ref 26.0–34.0)
MCHC: 33.2 g/dL (ref 30.0–36.0)
MCV: 84.1 fL (ref 80.0–100.0)
Monocytes Absolute: 0.6 K/uL (ref 0.1–1.0)
Monocytes Relative: 3 %
Neutro Abs: 4.6 K/uL (ref 1.7–7.7)
Neutrophils Relative %: 24 %
Platelet Count: 168 K/uL (ref 150–400)
RBC: 4.98 MIL/uL (ref 3.87–5.11)
RDW: 13.7 % (ref 11.5–15.5)
WBC Count: 19.1 K/uL — ABNORMAL HIGH (ref 4.0–10.5)
nRBC: 0 % (ref 0.0–0.2)

## 2024-01-21 LAB — LACTATE DEHYDROGENASE: LDH: 145 U/L (ref 98–192)

## 2025-01-26 ENCOUNTER — Inpatient Hospital Stay: Admitting: Hematology and Oncology

## 2025-01-26 ENCOUNTER — Inpatient Hospital Stay: Attending: Hematology and Oncology
